# Patient Record
Sex: Male | Born: 1970 | Race: Black or African American | Hispanic: No | Marital: Married | Smoking: Light tobacco smoker
Health system: Southern US, Community
[De-identification: ages and names within clinical notes are randomized; demographics above are authoritative.]

## PROBLEM LIST (undated history)

## (undated) DIAGNOSIS — E785 Hyperlipidemia, unspecified: Secondary | ICD-10-CM

## (undated) DIAGNOSIS — K219 Gastro-esophageal reflux disease without esophagitis: Secondary | ICD-10-CM

## (undated) DIAGNOSIS — F419 Anxiety disorder, unspecified: Secondary | ICD-10-CM

## (undated) DIAGNOSIS — F1020 Alcohol dependence, uncomplicated: Secondary | ICD-10-CM

## (undated) DIAGNOSIS — I10 Essential (primary) hypertension: Secondary | ICD-10-CM

## (undated) DIAGNOSIS — M5432 Sciatica, left side: Secondary | ICD-10-CM

## (undated) HISTORY — DX: Sciatica, left side: M54.32

## (undated) HISTORY — PX: OTHER SURGICAL HISTORY: SHX169

## (undated) HISTORY — DX: Alcohol dependence, uncomplicated: F10.20

## (undated) HISTORY — DX: Anxiety disorder, unspecified: F41.9

## (undated) HISTORY — DX: Morbid (severe) obesity due to excess calories: E66.01

## (undated) HISTORY — DX: Hyperlipidemia, unspecified: E78.5

## (undated) HISTORY — DX: Gastro-esophageal reflux disease without esophagitis: K21.9

---

## 2010-07-28 ENCOUNTER — Emergency Department (HOSPITAL_COMMUNITY)
Admission: EM | Admit: 2010-07-28 | Discharge: 2010-07-28 | Payer: Self-pay | Source: Home / Self Care | Admitting: Emergency Medicine

## 2010-10-27 LAB — RAPID STREP SCREEN (MED CTR MEBANE ONLY): Streptococcus, Group A Screen (Direct): NEGATIVE

## 2013-11-20 ENCOUNTER — Ambulatory Visit: Payer: Self-pay | Admitting: Podiatry

## 2014-11-01 ENCOUNTER — Ambulatory Visit (INDEPENDENT_AMBULATORY_CARE_PROVIDER_SITE_OTHER): Payer: BLUE CROSS/BLUE SHIELD

## 2014-11-01 ENCOUNTER — Ambulatory Visit (INDEPENDENT_AMBULATORY_CARE_PROVIDER_SITE_OTHER): Payer: BLUE CROSS/BLUE SHIELD | Admitting: Podiatry

## 2014-11-01 VITALS — BP 140/91 | HR 49 | Resp 15

## 2014-11-01 DIAGNOSIS — Q669 Congenital deformity of feet, unspecified, unspecified foot: Secondary | ICD-10-CM

## 2014-11-01 DIAGNOSIS — L989 Disorder of the skin and subcutaneous tissue, unspecified: Secondary | ICD-10-CM | POA: Diagnosis not present

## 2014-11-01 DIAGNOSIS — M79671 Pain in right foot: Secondary | ICD-10-CM

## 2014-11-01 DIAGNOSIS — M79672 Pain in left foot: Secondary | ICD-10-CM

## 2014-11-01 DIAGNOSIS — Q828 Other specified congenital malformations of skin: Secondary | ICD-10-CM

## 2014-11-01 NOTE — Progress Notes (Signed)
**Note Terrence-Identified via Obfuscation**    Subjective:    Patient ID: Terrence Conrad, male    DOB: [birth year], 44 y.o.   MRN: [id number]  HPI  44 year old male presents the office today for complaints of warts/calluses to the bottoms of his feet bilaterally. He states that he has these areas in both of his feet which are painful particularly with weightbearing and pressure. He states it. He tries to trim the areas himself. He states that when he trims them he does not have any bleeding or any drainage. He denies any recent redness around the areas. Denies any recent injury or trauma. No other complaints at this time.   Review of Systems  Musculoskeletal: Positive for back pain and neck pain.  All other systems reviewed and are negative.      Objective:   Physical Exam AAO x3, NAD DP/PT pulses palpable bilaterally, CRT less than 3 seconds Protective sensation intact with Simms Weinstein monofilament, vibratory sensation intact, Achilles tendon reflex intact There are multiple areas of hyperkeratotic lesions to bilateral plantar feet. There are 5 lesions on the right foot and 3 lesions on the left foot on the plantar aspect of the foot. Upon debridement there is no pinpoint bleeding, open lesions, drainage, or clinical signs of infection. There is mild tenderness palpation directly over the hyperkeratotic lesions prior to debridement.  No open lesions or pre-ulcerative lesions identified bilaterally. No other areas of tenderness to bilateral lower extremities. Decrease in medial arch height upon weightbearing bilaterally. MMT 5/5, ROM WNL. No overlying edema, erythema, increase in warmth to bilateral lower extremities.  No pain with calf compression, swelling, warmth, erythema bilaterally.      Assessment & Plan:  44 year old male with symptomatic hyperkeratotic lesions, flatfoot deformity. -X-rays were obtained and reviewed with the patient. -Treatment options were discussed including alternatives, risks,  complications. -Hyperkeratotic lesions are sharply debrided without complication/bleeding. Early no evidence of verruca. Pads placed around the lesions followed by salinocaine  and a bandage. Post procedure care was discussed with the patient. -Due to the numerous lesions and flatfoot deformity recommended orthotics. This time he wishes to proceed with custom orthotics. [facility] -Follow-up in orthotics arrive or sooner if any problems are to arise. In the meantime encouraged to call the office with any questions, concerns, change in symptoms.

## 2014-11-22 ENCOUNTER — Ambulatory Visit: Payer: BLUE CROSS/BLUE SHIELD | Admitting: *Deleted

## 2014-11-22 DIAGNOSIS — Q669 Congenital deformity of feet, unspecified, unspecified foot: Secondary | ICD-10-CM

## 2014-11-22 NOTE — Progress Notes (Signed)
**Note Terrence-Identified via Obfuscation** Patient ID: Terrence Conrad, male   DOB: 10/27/1970, 44 y.o.   MRN: [id number] PICKING UP INSERTS

## 2014-11-22 NOTE — Patient Instructions (Signed)

## 2014-12-27 ENCOUNTER — Ambulatory Visit (INDEPENDENT_AMBULATORY_CARE_PROVIDER_SITE_OTHER): Payer: BLUE CROSS/BLUE SHIELD | Admitting: Podiatry

## 2014-12-27 ENCOUNTER — Encounter: Payer: Self-pay | Admitting: Podiatry

## 2014-12-27 VITALS — BP 139/90 | HR 71 | Resp 14

## 2014-12-27 DIAGNOSIS — B372 Candidiasis of skin and nail: Secondary | ICD-10-CM

## 2014-12-27 MED ORDER — TERBINAFINE HCL 250 MG PO TABS: 250.0000 mg | ORAL_TABLET | Freq: Every day | ORAL | Status: AC

## 2014-12-29 NOTE — Progress Notes (Signed)
**Note Terrence-Identified via Obfuscation** Subjective:     Patient ID: Terrence Conrad, male   DOB: [birth year], 44 y.o.   MRN: [id number]  HPI patient is a patient of Dr. Ardelle AntonWagoner and presents with a strain on the plantar aspect of both feet with some itchy component but mostly just irritated type tissue   Review of Systems     Objective:   Physical Exam Neurovascular status intact no change in health history with irritated plantar foot condition bilateral and a moccasin appearance with small blisters but no drainage    Assessment:     Probable fungal infection plantar aspect bilateral    Plan:     Advised on soaks and we'll start him on oral Lamisil 250 mg daily first 45 days. Patient will reappoint if symptoms do not get better or get worse or if any other issues should occur

## 2015-10-06 ENCOUNTER — Emergency Department (HOSPITAL_COMMUNITY)
Admission: EM | Admit: 2015-10-06 | Discharge: 2015-10-06 | Disposition: A | Discharge status: Home / Self Care | Payer: BLUE CROSS/BLUE SHIELD | Attending: Emergency Medicine | Admitting: Emergency Medicine

## 2015-10-06 ENCOUNTER — Encounter (HOSPITAL_COMMUNITY): Payer: Self-pay

## 2015-10-06 DIAGNOSIS — F17210 Nicotine dependence, cigarettes, uncomplicated: Secondary | ICD-10-CM | POA: Diagnosis not present

## 2015-10-06 DIAGNOSIS — Z79899 Other long term (current) drug therapy: Secondary | ICD-10-CM | POA: Diagnosis not present

## 2015-10-06 DIAGNOSIS — J029 Acute pharyngitis, unspecified: Secondary | ICD-10-CM | POA: Diagnosis present

## 2015-10-06 DIAGNOSIS — I10 Essential (primary) hypertension: Secondary | ICD-10-CM | POA: Insufficient documentation

## 2015-10-06 DIAGNOSIS — J36 Peritonsillar abscess: Secondary | ICD-10-CM

## 2015-10-06 HISTORY — DX: Essential (primary) hypertension: I10

## 2015-10-06 LAB — CBC WITH DIFFERENTIAL/PLATELET
BASOS PCT: 0 %
Basophils Absolute: 0 10*3/uL (ref 0.0–0.1)
EOS ABS: 0 10*3/uL (ref 0.0–0.7)
EOS PCT: 0 %
HCT: 43.3 % (ref 39.0–52.0)
HEMOGLOBIN: 13.9 g/dL (ref 13.0–17.0)
Lymphocytes Relative: 13 %
Lymphs Abs: 2.2 10*3/uL (ref 0.7–4.0)
MCH: 26.6 pg (ref 26.0–34.0)
MCHC: 32.1 g/dL (ref 30.0–36.0)
MCV: 82.8 fL (ref 78.0–100.0)
Monocytes Absolute: 1.3 10*3/uL — ABNORMAL HIGH (ref 0.1–1.0)
Monocytes Relative: 8 %
NEUTROS PCT: 79 %
Neutro Abs: 13.2 10*3/uL — ABNORMAL HIGH (ref 1.7–7.7)
PLATELETS: 319 10*3/uL (ref 150–400)
RBC: 5.23 MIL/uL (ref 4.22–5.81)
RDW: 14.9 % (ref 11.5–15.5)
WBC: 16.7 10*3/uL — AB (ref 4.0–10.5)

## 2015-10-06 LAB — I-STAT CHEM 8, ED
BUN: 16 mg/dL (ref 6–20)
CALCIUM ION: 1.16 mmol/L (ref 1.12–1.23)
CHLORIDE: 103 mmol/L (ref 101–111)
Creatinine, Ser: 1.1 mg/dL (ref 0.61–1.24)
Glucose, Bld: 87 mg/dL (ref 65–99)
HEMATOCRIT: 48 % (ref 39.0–52.0)
Hemoglobin: 16.3 g/dL (ref 13.0–17.0)
Potassium: 3.8 mmol/L (ref 3.5–5.1)
SODIUM: 143 mmol/L (ref 135–145)
TCO2: 29 mmol/L (ref 0–100)

## 2015-10-06 MED ORDER — AMOXICILLIN-POT CLAVULANATE 875-125 MG PO TABS: 1.0000 | ORAL_TABLET | Freq: Two times a day (BID) | ORAL | Status: DC

## 2015-10-06 MED ORDER — SODIUM CHLORIDE 0.9 % IV SOLN
3.0000 g | Freq: Once | INTRAVENOUS | Status: AC
  Administered 2015-10-06: 3 g via INTRAVENOUS
  Filled 2015-10-06: qty 3

## 2015-10-06 MED ORDER — SODIUM CHLORIDE 0.9 % IV BOLUS (SEPSIS)
1000.0000 mL | Freq: Once | INTRAVENOUS | Status: AC
Start: 2015-10-06 — End: 2015-10-06
  Administered 2015-10-06: 1000 mL via INTRAVENOUS

## 2015-10-06 MED ORDER — HYDROMORPHONE HCL 1 MG/ML IJ SOLN
1.0000 mg | Freq: Once | INTRAMUSCULAR | Status: AC
  Administered 2015-10-06: 1 mg via INTRAVENOUS
  Filled 2015-10-06: qty 1

## 2015-10-06 MED ORDER — HYDROCODONE-ACETAMINOPHEN 7.5-325 MG/15ML PO SOLN
15.0000 mL | Freq: Four times a day (QID) | ORAL | Status: DC | PRN
Start: 2015-10-06 — End: 2019-04-30

## 2015-10-06 MED ORDER — DEXAMETHASONE SODIUM PHOSPHATE 10 MG/ML IJ SOLN
10.0000 mg | Freq: Once | INTRAMUSCULAR | Status: AC
  Administered 2015-10-06: 10 mg via INTRAVENOUS
  Filled 2015-10-06: qty 1

## 2015-10-06 NOTE — ED Provider Notes (Signed)
CSN: [id number]     Arrival date & time 10/06/15  1529 History   First MD Initiated Contact with Patient 10/06/15 1825     Chief Complaint  Patient presents with  . peritonsilar abscess    The history is provided by the patient.   patient presents with sore throat for last 3 days. Is on the left side. Some pain with swallowing skills been able swallow. States he's been having chills. Sent at urgent care and sent here for peritonsillar abscess. He is otherwise healthy but does have a history of hypertension. No difficulty breathing. Some difficulty opening the jaw due to the pain.  Past Medical History  Diagnosis Date  . Hypertension    History reviewed. No pertinent past surgical history. No family history on file. Social History  Substance Use Topics  . Smoking status: Light Tobacco Smoker  . Smokeless tobacco: None  . Alcohol Use: None    Review of Systems  Constitutional: Positive for chills and appetite change.  HENT: Positive for sore throat, trouble swallowing and voice change. Negative for drooling and facial swelling.   Respiratory: Negative for shortness of breath.   Cardiovascular: Negative for chest pain.  Gastrointestinal: Negative for abdominal pain.  Genitourinary: Negative for flank pain.  Musculoskeletal: Negative for back pain.  Skin: Negative for wound.      Allergies  Review of patient's allergies indicates no known allergies.  Home Medications   Prior to Admission medications   Medication Sig Start Date End Date Taking? Authorizing Provider  ciprofloxacin (CIPRO) 500 MG tablet Take 500 mg by mouth 2 (two) times daily. for 10 days 09/30/15 10/09/15 Yes Historical Provider, MD  ibuprofen (ADVIL,MOTRIN) 800 MG tablet Take 800 mg by mouth 3 (three) times daily as needed for moderate pain.  09/23/15  Yes Historical Provider, MD  losartan (COZAAR) 50 MG tablet Take 50 mg by mouth daily. 09/02/15  Yes Historical Provider, MD  mometasone (ELOCON) 0.1 % cream Apply  1 application topically daily as needed (FOR IRRITATION).  09/06/15  Yes Historical Provider, MD  omeprazole (PRILOSEC) 20 MG capsule Take 20 mg by mouth daily. 08/04/15  Yes Historical Provider, MD  Phenylephrine-DM-GG (MUCINEX FAST-MAX CONGEST COUGH) 2.5-5-100 MG/5ML LIQD Take 5 mLs by mouth daily as needed (for congestion).   Yes Historical Provider, MD  tiZANidine (ZANAFLEX) 4 MG tablet Take 4 mg by mouth every 8 (eight) hours as needed for muscle spasms.  09/06/15  Yes Historical Provider, MD  amoxicillin-clavulanate (AUGMENTIN) 875-125 MG tablet Take 1 tablet by mouth every 12 (twelve) hours. 10/06/15   Benjiman Core, MD  HYDROcodone-acetaminophen (HYCET) 7.5-325 mg/15 ml solution Take 15 mLs by mouth every 6 (six) hours as needed for moderate pain. 10/06/15   Benjiman Core, MD  terbinafine (LAMISIL) 250 MG tablet Take 1 tablet (250 mg total) by mouth daily. 12/27/14   Lenn Sink, DPM   BP 139/77 mmHg  Pulse 69  Temp(Src) 100.5 F (38.1 C) (Oral)  Resp 18  Ht  (1.88 m)  Wt 260 lb (117.935 kg)  BMI 33.37 kg/m2  SpO2 98% Physical Exam  Constitutional: He appears well-nourished.  HENT:  Posterior pharyngeal edema and swelling on left side. Peritonsillar swelling. Uvula shifted to right. Some trismus. Some swelling on left neck anteriorly.  Eyes: EOM are normal.  Neck: Neck supple.  Cardiovascular: Normal rate.   Pulmonary/Chest: Effort normal. He has no wheezes.  Abdominal: Soft. There is no tenderness.  Musculoskeletal: Normal range of motion.  Lymphadenopathy:  He has cervical adenopathy.  Neurological: He is alert.  Skin: Skin is warm. No erythema.    ED Course  Procedures (including critical care time) Labs Review Labs Reviewed  CBC WITH DIFFERENTIAL/PLATELET - Abnormal; Notable for the following:    WBC 16.7 (*)    Neutro Abs 13.2 (*)    Monocytes Absolute 1.3 (*)    All other components within normal limits  I-STAT CHEM 8, ED    Imaging Review No  results found. I have personally reviewed and evaluated these images and lab results as part of my medical decision-making.   EKG Interpretation None      MDM   Final diagnoses:  Peritonsillar abscess    Patient possible peritonsillar abscess. Feels much better after treatment. Not having difficulty breathing or handling secretions. Discussed with Dr. Jearld Fenton, will see the patient in the office tomorrow. Will discharge home.    Benjiman Core, MD 10/07/15 [phone]

## 2015-10-06 NOTE — Discharge Instructions (Signed)
Peritonsillar Abscess °A peritonsillar abscess is a collection of yellowish-white fluid (pus) in the back of the throat behind the tonsils. It usually occurs when an infection of the throat or tonsils (tonsillitis) spreads into the tissues around the tonsils. °CAUSES °The infection that leads to a peritonsillar abscess is usually caused by streptococcal bacteria.  °SIGNS AND SYMPTOMS °· Sore throat, often with pain on just one side. °· Swelling and tenderness of the glands (lymph nodes) in the neck. °· Difficulty swallowing. °· Difficulty opening your mouth. °· Fever. °· Chills. °· Drooling because of difficulty swallowing saliva. °· Headache. °· Changes in your voice. °· Bad breath. °DIAGNOSIS °Your health care provider will take your medical history and do a physical exam. Imaging tests may be done, such as an ultrasound or CT scan. A sample of pus may be removed from the abscess using a needle (needle aspiration) or by swabbing the back of your throat. This sample will be sent to a lab for testing. °TREATMENT °Treatment usually involves draining the pus from the abscess. This may be done through needle aspiration or by making an incision in the abscess. You will also likely need to take antibiotic medicine. °HOME CARE INSTRUCTIONS °· Rest as much as possible and get plenty of sleep. °· Take medicines only as directed by your health care provider. °· If you were prescribed an antibiotic medicine, finish it all even if you start to feel better. °· If your abscess was drained by your health care provider, gargle with a mixture of salt and warm water: °¨ Mix 1 tsp of salt in 8 oz of warm water. °¨ Gargle with this mixture four times per day or as needed for comfort. °¨ Do not swallow this mixture. °· Drink plenty of fluids. °· While your throat is sore, eat soft or liquid foods, such as frozen ice pops and ice cream. °· Keep all follow-up visits as directed by your health care provider. This is important. °SEEK  MEDICAL CARE IF: °· You have increased pain, swelling, redness, or drainage in your throat. °· You develop a headache, a lack of energy (lethargy), or generalized feelings of illness. °· You have a fever. °· You feel dizzy. °· You have difficulty swallowing or eating. °· You show signs of becoming dehydrated, such as: °¨ Light-headedness when standing. °¨ Decreased urine output. °¨ A fast heart rate. °¨ Dry mouth. °SEEK IMMEDIATE MEDICAL CARE IF:  °· You have difficulty talking or breathing, or you find it easier to breathe when you lean forward. °· You are coughing up blood or vomiting blood. °· You have severe throat pain that is not helped by medicines. °· You start to drool. °  °This information is not intended to replace advice given to you by your health care provider. Make sure you discuss any questions you have with your health care provider. °  °Document Released: 08/02/2005 Document Revised: 08/23/2014 Document Reviewed: 03/18/2014 °Elsevier Interactive Patient Education ©2016 [employer] ° °

## 2015-10-06 NOTE — ED Notes (Signed)
Patient here with 3 days of sore throat, here from urgent care with peritonsillar abscess

## 2016-05-28 DIAGNOSIS — Z716 Tobacco abuse counseling: Secondary | ICD-10-CM | POA: Diagnosis not present

## 2016-05-28 DIAGNOSIS — I10 Essential (primary) hypertension: Secondary | ICD-10-CM | POA: Diagnosis not present

## 2016-05-28 DIAGNOSIS — L2082 Flexural eczema: Secondary | ICD-10-CM | POA: Diagnosis not present

## 2016-05-28 DIAGNOSIS — R7303 Prediabetes: Secondary | ICD-10-CM | POA: Diagnosis not present

## 2016-08-25 DIAGNOSIS — Z13220 Encounter for screening for lipoid disorders: Secondary | ICD-10-CM | POA: Diagnosis not present

## 2016-08-25 DIAGNOSIS — R7309 Other abnormal glucose: Secondary | ICD-10-CM | POA: Diagnosis not present

## 2016-08-25 DIAGNOSIS — R12 Heartburn: Secondary | ICD-10-CM | POA: Diagnosis not present

## 2016-08-25 DIAGNOSIS — Z125 Encounter for screening for malignant neoplasm of prostate: Secondary | ICD-10-CM | POA: Diagnosis not present

## 2016-08-25 DIAGNOSIS — Z0000 Encounter for general adult medical examination without abnormal findings: Secondary | ICD-10-CM | POA: Diagnosis not present

## 2016-08-25 DIAGNOSIS — I10 Essential (primary) hypertension: Secondary | ICD-10-CM | POA: Diagnosis not present

## 2016-08-25 DIAGNOSIS — Z720 Tobacco use: Secondary | ICD-10-CM | POA: Diagnosis not present

## 2017-08-31 DIAGNOSIS — E119 Type 2 diabetes mellitus without complications: Secondary | ICD-10-CM | POA: Diagnosis not present

## 2017-08-31 DIAGNOSIS — Z23 Encounter for immunization: Secondary | ICD-10-CM | POA: Diagnosis not present

## 2017-08-31 DIAGNOSIS — R12 Heartburn: Secondary | ICD-10-CM | POA: Diagnosis not present

## 2017-08-31 DIAGNOSIS — I10 Essential (primary) hypertension: Secondary | ICD-10-CM | POA: Diagnosis not present

## 2017-08-31 DIAGNOSIS — Z720 Tobacco use: Secondary | ICD-10-CM | POA: Diagnosis not present

## 2017-09-30 DIAGNOSIS — R609 Edema, unspecified: Secondary | ICD-10-CM | POA: Diagnosis not present

## 2017-09-30 DIAGNOSIS — M79606 Pain in leg, unspecified: Secondary | ICD-10-CM | POA: Diagnosis not present

## 2017-09-30 DIAGNOSIS — M79604 Pain in right leg: Secondary | ICD-10-CM | POA: Diagnosis not present

## 2017-09-30 DIAGNOSIS — M7989 Other specified soft tissue disorders: Secondary | ICD-10-CM | POA: Diagnosis not present

## 2017-10-04 DIAGNOSIS — M79661 Pain in right lower leg: Secondary | ICD-10-CM | POA: Diagnosis not present

## 2017-10-07 ENCOUNTER — Other Ambulatory Visit: Payer: Self-pay | Admitting: Physician Assistant

## 2017-10-07 DIAGNOSIS — M7989 Other specified soft tissue disorders: Secondary | ICD-10-CM | POA: Diagnosis not present

## 2017-10-07 DIAGNOSIS — M25571 Pain in right ankle and joints of right foot: Secondary | ICD-10-CM | POA: Diagnosis not present

## 2017-10-07 DIAGNOSIS — M79604 Pain in right leg: Secondary | ICD-10-CM

## 2017-10-07 DIAGNOSIS — M79606 Pain in leg, unspecified: Secondary | ICD-10-CM | POA: Diagnosis not present

## 2017-10-08 ENCOUNTER — Ambulatory Visit (HOSPITAL_COMMUNITY)
Admission: RE | Admit: 2017-10-08 | Discharge: 2017-10-08 | Disposition: A | Discharge status: Home / Self Care | Payer: BLUE CROSS/BLUE SHIELD | Source: Ambulatory Visit | Attending: Physician Assistant | Admitting: Physician Assistant

## 2017-10-08 ENCOUNTER — Encounter (INDEPENDENT_AMBULATORY_CARE_PROVIDER_SITE_OTHER): Payer: Self-pay

## 2017-10-08 DIAGNOSIS — M79604 Pain in right leg: Secondary | ICD-10-CM | POA: Diagnosis not present

## 2017-10-08 DIAGNOSIS — M7989 Other specified soft tissue disorders: Secondary | ICD-10-CM | POA: Diagnosis not present

## 2017-10-08 NOTE — Progress Notes (Signed)
VASCULAR LAB PRELIMINARY  PRELIMINARY  PRELIMINARY  PRELIMINARY  Right lower extremity venous duplex completed.    Preliminary report:  There is no DVT or SVT noted in the right lower extremity.  Hypoechoic are noted in the proximal calf at site of pain, possibly consistent with muscle tear.   Rogena Deupree, RVT 10/08/2017, 10:16 AM

## 2017-10-17 DIAGNOSIS — M25571 Pain in right ankle and joints of right foot: Secondary | ICD-10-CM | POA: Diagnosis not present

## 2017-10-19 DIAGNOSIS — S86111D Strain of other muscle(s) and tendon(s) of posterior muscle group at lower leg level, right leg, subsequent encounter: Secondary | ICD-10-CM | POA: Diagnosis not present

## 2017-10-19 DIAGNOSIS — M79661 Pain in right lower leg: Secondary | ICD-10-CM | POA: Diagnosis not present

## 2017-10-19 DIAGNOSIS — R262 Difficulty in walking, not elsewhere classified: Secondary | ICD-10-CM | POA: Diagnosis not present

## 2017-10-19 DIAGNOSIS — M6281 Muscle weakness (generalized): Secondary | ICD-10-CM | POA: Diagnosis not present

## 2017-10-31 DIAGNOSIS — M6281 Muscle weakness (generalized): Secondary | ICD-10-CM | POA: Diagnosis not present

## 2017-11-08 DIAGNOSIS — R262 Difficulty in walking, not elsewhere classified: Secondary | ICD-10-CM | POA: Diagnosis not present

## 2017-11-08 DIAGNOSIS — M6281 Muscle weakness (generalized): Secondary | ICD-10-CM | POA: Diagnosis not present

## 2017-11-08 DIAGNOSIS — S86111D Strain of other muscle(s) and tendon(s) of posterior muscle group at lower leg level, right leg, subsequent encounter: Secondary | ICD-10-CM | POA: Diagnosis not present

## 2017-11-08 DIAGNOSIS — M79661 Pain in right lower leg: Secondary | ICD-10-CM | POA: Diagnosis not present

## 2017-11-10 DIAGNOSIS — S86111D Strain of other muscle(s) and tendon(s) of posterior muscle group at lower leg level, right leg, subsequent encounter: Secondary | ICD-10-CM | POA: Diagnosis not present

## 2017-11-10 DIAGNOSIS — M79661 Pain in right lower leg: Secondary | ICD-10-CM | POA: Diagnosis not present

## 2017-11-10 DIAGNOSIS — R262 Difficulty in walking, not elsewhere classified: Secondary | ICD-10-CM | POA: Diagnosis not present

## 2017-11-10 DIAGNOSIS — M6281 Muscle weakness (generalized): Secondary | ICD-10-CM | POA: Diagnosis not present

## 2017-11-15 DIAGNOSIS — M79661 Pain in right lower leg: Secondary | ICD-10-CM | POA: Diagnosis not present

## 2017-11-15 DIAGNOSIS — M6281 Muscle weakness (generalized): Secondary | ICD-10-CM | POA: Diagnosis not present

## 2017-11-15 DIAGNOSIS — R262 Difficulty in walking, not elsewhere classified: Secondary | ICD-10-CM | POA: Diagnosis not present

## 2017-11-15 DIAGNOSIS — S86111D Strain of other muscle(s) and tendon(s) of posterior muscle group at lower leg level, right leg, subsequent encounter: Secondary | ICD-10-CM | POA: Diagnosis not present

## 2017-11-17 DIAGNOSIS — R262 Difficulty in walking, not elsewhere classified: Secondary | ICD-10-CM | POA: Diagnosis not present

## 2017-11-17 DIAGNOSIS — S86111D Strain of other muscle(s) and tendon(s) of posterior muscle group at lower leg level, right leg, subsequent encounter: Secondary | ICD-10-CM | POA: Diagnosis not present

## 2017-11-17 DIAGNOSIS — M79661 Pain in right lower leg: Secondary | ICD-10-CM | POA: Diagnosis not present

## 2017-11-17 DIAGNOSIS — M6281 Muscle weakness (generalized): Secondary | ICD-10-CM | POA: Diagnosis not present

## 2018-02-10 DIAGNOSIS — L080 Pyoderma: Secondary | ICD-10-CM | POA: Diagnosis not present

## 2018-02-10 DIAGNOSIS — L309 Dermatitis, unspecified: Secondary | ICD-10-CM | POA: Diagnosis not present

## 2018-02-13 DIAGNOSIS — L2082 Flexural eczema: Secondary | ICD-10-CM | POA: Diagnosis not present

## 2018-02-13 DIAGNOSIS — I10 Essential (primary) hypertension: Secondary | ICD-10-CM | POA: Diagnosis not present

## 2018-02-13 DIAGNOSIS — Z125 Encounter for screening for malignant neoplasm of prostate: Secondary | ICD-10-CM | POA: Diagnosis not present

## 2018-02-13 DIAGNOSIS — Z0000 Encounter for general adult medical examination without abnormal findings: Secondary | ICD-10-CM | POA: Diagnosis not present

## 2018-02-13 DIAGNOSIS — Z13220 Encounter for screening for lipoid disorders: Secondary | ICD-10-CM | POA: Diagnosis not present

## 2018-02-13 DIAGNOSIS — E119 Type 2 diabetes mellitus without complications: Secondary | ICD-10-CM | POA: Diagnosis not present

## 2018-02-13 DIAGNOSIS — E669 Obesity, unspecified: Secondary | ICD-10-CM | POA: Diagnosis not present

## 2018-12-04 DIAGNOSIS — K219 Gastro-esophageal reflux disease without esophagitis: Secondary | ICD-10-CM | POA: Diagnosis not present

## 2018-12-04 DIAGNOSIS — E119 Type 2 diabetes mellitus without complications: Secondary | ICD-10-CM | POA: Diagnosis not present

## 2018-12-04 DIAGNOSIS — M62838 Other muscle spasm: Secondary | ICD-10-CM | POA: Diagnosis not present

## 2018-12-04 DIAGNOSIS — I10 Essential (primary) hypertension: Secondary | ICD-10-CM | POA: Diagnosis not present

## 2018-12-07 DIAGNOSIS — E119 Type 2 diabetes mellitus without complications: Secondary | ICD-10-CM | POA: Diagnosis not present

## 2018-12-07 DIAGNOSIS — I10 Essential (primary) hypertension: Secondary | ICD-10-CM | POA: Diagnosis not present

## 2018-12-07 DIAGNOSIS — R5383 Other fatigue: Secondary | ICD-10-CM | POA: Diagnosis not present

## 2019-04-30 ENCOUNTER — Emergency Department (HOSPITAL_COMMUNITY): Payer: BC Managed Care – PPO

## 2019-04-30 ENCOUNTER — Emergency Department (HOSPITAL_COMMUNITY)
Admission: EM | Admit: 2019-04-30 | Discharge: 2019-04-30 | Disposition: A | Discharge status: Home / Self Care | Payer: BC Managed Care – PPO | Attending: Emergency Medicine | Admitting: Emergency Medicine

## 2019-04-30 ENCOUNTER — Encounter (HOSPITAL_COMMUNITY): Payer: Self-pay | Admitting: Emergency Medicine

## 2019-04-30 ENCOUNTER — Other Ambulatory Visit: Payer: Self-pay

## 2019-04-30 DIAGNOSIS — M25531 Pain in right wrist: Secondary | ICD-10-CM | POA: Insufficient documentation

## 2019-04-30 DIAGNOSIS — S199XXA Unspecified injury of neck, initial encounter: Secondary | ICD-10-CM | POA: Diagnosis not present

## 2019-04-30 DIAGNOSIS — S20211A Contusion of right front wall of thorax, initial encounter: Secondary | ICD-10-CM | POA: Diagnosis not present

## 2019-04-30 DIAGNOSIS — Y92410 Unspecified street and highway as the place of occurrence of the external cause: Secondary | ICD-10-CM | POA: Insufficient documentation

## 2019-04-30 DIAGNOSIS — Y998 Other external cause status: Secondary | ICD-10-CM | POA: Diagnosis not present

## 2019-04-30 DIAGNOSIS — S299XXA Unspecified injury of thorax, initial encounter: Secondary | ICD-10-CM | POA: Diagnosis not present

## 2019-04-30 DIAGNOSIS — S20212A Contusion of left front wall of thorax, initial encounter: Secondary | ICD-10-CM

## 2019-04-30 DIAGNOSIS — S301XXA Contusion of abdominal wall, initial encounter: Secondary | ICD-10-CM | POA: Diagnosis not present

## 2019-04-30 DIAGNOSIS — Z79899 Other long term (current) drug therapy: Secondary | ICD-10-CM | POA: Insufficient documentation

## 2019-04-30 DIAGNOSIS — M7989 Other specified soft tissue disorders: Secondary | ICD-10-CM | POA: Diagnosis not present

## 2019-04-30 DIAGNOSIS — Y9389 Activity, other specified: Secondary | ICD-10-CM | POA: Diagnosis not present

## 2019-04-30 DIAGNOSIS — I10 Essential (primary) hypertension: Secondary | ICD-10-CM | POA: Insufficient documentation

## 2019-04-30 DIAGNOSIS — R079 Chest pain, unspecified: Secondary | ICD-10-CM | POA: Diagnosis not present

## 2019-04-30 DIAGNOSIS — R609 Edema, unspecified: Secondary | ICD-10-CM | POA: Diagnosis not present

## 2019-04-30 DIAGNOSIS — S0990XA Unspecified injury of head, initial encounter: Secondary | ICD-10-CM | POA: Insufficient documentation

## 2019-04-30 DIAGNOSIS — F17210 Nicotine dependence, cigarettes, uncomplicated: Secondary | ICD-10-CM | POA: Diagnosis not present

## 2019-04-30 DIAGNOSIS — S6991XA Unspecified injury of right wrist, hand and finger(s), initial encounter: Secondary | ICD-10-CM | POA: Diagnosis not present

## 2019-04-30 DIAGNOSIS — S3993XA Unspecified injury of pelvis, initial encounter: Secondary | ICD-10-CM | POA: Diagnosis not present

## 2019-04-30 LAB — COMPREHENSIVE METABOLIC PANEL
ALT: 24 U/L (ref 0–44)
AST: 24 U/L (ref 15–41)
Albumin: 3.4 g/dL — ABNORMAL LOW (ref 3.5–5.0)
Alkaline Phosphatase: 75 U/L (ref 38–126)
Anion gap: 10 (ref 5–15)
BUN: 10 mg/dL (ref 6–20)
CO2: 22 mmol/L (ref 22–32)
Calcium: 9 mg/dL (ref 8.9–10.3)
Chloride: 107 mmol/L (ref 98–111)
Creatinine, Ser: 1.01 mg/dL (ref 0.61–1.24)
GFR calc Af Amer: >60 mL/min (ref >60)
GFR calc non Af Amer: >60 mL/min (ref >60)
Glucose, Bld: 167 mg/dL — ABNORMAL HIGH (ref 70–99)
Potassium: 3.6 mmol/L (ref 3.5–5.1)
Sodium: 139 mmol/L (ref 135–145)
Total Bilirubin: 0.5 mg/dL (ref 0.3–1.2)
Total Protein: 6.6 g/dL (ref 6.5–8.1)

## 2019-04-30 LAB — URINALYSIS, ROUTINE W REFLEX MICROSCOPIC
Bilirubin Urine: NEGATIVE
Glucose, UA: NEGATIVE mg/dL
Hgb urine dipstick: NEGATIVE
Ketones, ur: NEGATIVE mg/dL
Leukocytes,Ua: NEGATIVE
Nitrite: NEGATIVE
Protein, ur: NEGATIVE mg/dL
Specific Gravity, Urine: 1.045 — ABNORMAL HIGH (ref 1.005–1.030)
pH: 7 (ref 5.0–8.0)

## 2019-04-30 LAB — I-STAT CHEM 8, ED
BUN: 12 mg/dL (ref 6–20)
Calcium, Ion: 1.13 mmol/L — ABNORMAL LOW (ref 1.15–1.40)
Chloride: 107 mmol/L (ref 98–111)
Creatinine, Ser: 1 mg/dL (ref 0.61–1.24)
Glucose, Bld: 161 mg/dL — ABNORMAL HIGH (ref 70–99)
HCT: 40 % (ref 39.0–52.0)
Hemoglobin: 13.6 g/dL (ref 13.0–17.0)
Potassium: 3.6 mmol/L (ref 3.5–5.1)
Sodium: 142 mmol/L (ref 135–145)
TCO2: 25 mmol/L (ref 22–32)

## 2019-04-30 LAB — CBC
HCT: 40.9 % (ref 39.0–52.0)
Hemoglobin: 12.6 g/dL — ABNORMAL LOW (ref 13.0–17.0)
MCH: 25.3 pg — ABNORMAL LOW (ref 26.0–34.0)
MCHC: 30.8 g/dL (ref 30.0–36.0)
MCV: 82 fL (ref 80.0–100.0)
Platelets: 320 10*3/uL (ref 150–400)
RBC: 4.99 MIL/uL (ref 4.22–5.81)
RDW: 16.3 % — ABNORMAL HIGH (ref 11.5–15.5)
WBC: 9.1 10*3/uL (ref 4.0–10.5)
nRBC: 0 % (ref 0.0–0.2)

## 2019-04-30 LAB — SAMPLE TO BLOOD BANK

## 2019-04-30 LAB — ETHANOL: Alcohol, Ethyl (B): <10 mg/dL (ref <10)

## 2019-04-30 LAB — LACTIC ACID, PLASMA: Lactic Acid, Venous: 1.9 mmol/L (ref 0.5–1.9)

## 2019-04-30 LAB — PROTIME-INR
INR: 0.9 (ref 0.8–1.2)
Prothrombin Time: 12.4 seconds (ref 11.4–15.2)

## 2019-04-30 MED ORDER — IOHEXOL 300 MG/ML  SOLN
125.0000 mL | Freq: Once | INTRAMUSCULAR | Status: AC | PRN
  Administered 2019-04-30: 125 mL via INTRAVENOUS

## 2019-04-30 MED ORDER — LOSARTAN POTASSIUM 50 MG PO TABS
75.0000 mg | ORAL_TABLET | Freq: Once | ORAL | Status: AC
  Administered 2019-04-30: 75 mg via ORAL
  Filled 2019-04-30: qty 2

## 2019-04-30 MED ORDER — HYDROCODONE-ACETAMINOPHEN 5-325 MG PO TABS: 1.0000 | ORAL_TABLET | ORAL | 0 refills | Status: AC | PRN

## 2019-04-30 MED ORDER — METHOCARBAMOL 500 MG PO TABS: 500.0000 mg | ORAL_TABLET | Freq: Three times a day (TID) | ORAL | 0 refills | Status: AC | PRN

## 2019-04-30 MED ORDER — LOSARTAN POTASSIUM 50 MG PO TABS: 50.0000 mg | ORAL_TABLET | Freq: Once | ORAL | Status: DC

## 2019-04-30 MED ORDER — IBUPROFEN 800 MG PO TABS: 800.0000 mg | ORAL_TABLET | Freq: Four times a day (QID) | ORAL | 0 refills | Status: AC | PRN

## 2019-04-30 MED ORDER — HYDROCODONE-ACETAMINOPHEN 5-325 MG PO TABS
2.0000 | ORAL_TABLET | Freq: Once | ORAL | Status: AC
  Administered 2019-04-30: 2 via ORAL
  Filled 2019-04-30: qty 2

## 2019-04-30 MED ORDER — FENTANYL CITRATE (PF) 100 MCG/2ML IJ SOLN
100.0000 ug | Freq: Once | INTRAMUSCULAR | Status: AC
  Administered 2019-04-30: 100 ug via INTRAVENOUS
  Filled 2019-04-30: qty 2

## 2019-04-30 NOTE — ED Provider Notes (Signed)
Signed out to d/c to home after wrist xray which was added.  Wrist xray reviewed - no def fx. sts noted. On recheck of patient, there is pain/tenderness on ulnar side of wrist/distal ulnar area, there is no scaphoid/snuff box pain or tenderness. Volar wrist splint for comfort. Icepack.   Pt requests pain med and his bp med. Hydrocodone po. Losartan po.   bp 174/96, hr 72, rr 16. Pulse ox is currently 98%. Breathing comfortable. Recheck abd soft non tender, no rebound or guarding.   Tolerating po. Ambulates w steady gait.   Patient currently appears stable for d/c.      Lajean Saver, MD 04/30/19 [phone]

## 2019-04-30 NOTE — Progress Notes (Signed)
Orthopedic Tech Progress Note Patient Details:  Terrence Conrad [birth year] [id number]  Ortho Devices Type of Ortho Device: Velcro wrist splint Ortho Device/Splint Interventions: Adjustment, Application   Post Interventions Patient Tolerated: Well Instructions Provided: Care of device   Melony Overly T 04/30/2019, 8:53 AM

## 2019-04-30 NOTE — Discharge Instructions (Addendum)
It was our pleasure to provide your ER care today - we hope that you feel better.  Wear wrist splint as needed for comfort/support for the next few days.   Icepack/cold to sore areas.   Take motrin or aleve as need for pain. You may also take hydrocodone as need for pain. No driving for the next 6 hours or when taking hydrocodone. Also, do not take tylenol or acetaminophen containing medication when taking hydrocodone. Take robaxin as need for muscle pain/spasm - no driving when taking.   Your blood pressure is high today - continue your medication, limit salt intake, and follow up with primary care doctor in the coming week. Also follow up with your doctor in the next couple weeks for recheck should pain/symptoms fail to improve/resolve.   Return to ER right away if worse, new symptoms, fevers, worsening or severe pain, worsening or severe abdominal pain, trouble breathing, or other concern.

## 2019-04-30 NOTE — ED Notes (Signed)
Ortho paged. 
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
## 2019-04-30 NOTE — ED Notes (Signed)
Patient ambulating within room without assist.
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
## 2019-05-02 LAB — CDS SEROLOGY

## 2019-05-03 DIAGNOSIS — S134XXA Sprain of ligaments of cervical spine, initial encounter: Secondary | ICD-10-CM | POA: Diagnosis not present

## 2019-05-18 DIAGNOSIS — M542 Cervicalgia: Secondary | ICD-10-CM | POA: Diagnosis not present

## 2019-05-18 DIAGNOSIS — E119 Type 2 diabetes mellitus without complications: Secondary | ICD-10-CM | POA: Diagnosis not present

## 2019-05-18 DIAGNOSIS — I10 Essential (primary) hypertension: Secondary | ICD-10-CM | POA: Diagnosis not present

## 2019-05-18 DIAGNOSIS — K219 Gastro-esophageal reflux disease without esophagitis: Secondary | ICD-10-CM | POA: Diagnosis not present

## 2019-07-23 DIAGNOSIS — R202 Paresthesia of skin: Secondary | ICD-10-CM | POA: Diagnosis not present

## 2019-07-23 DIAGNOSIS — F419 Anxiety disorder, unspecified: Secondary | ICD-10-CM | POA: Diagnosis not present

## 2019-07-23 DIAGNOSIS — G4709 Other insomnia: Secondary | ICD-10-CM | POA: Diagnosis not present

## 2019-12-04 ENCOUNTER — Other Ambulatory Visit: Payer: Self-pay | Admitting: Physician Assistant

## 2019-12-26 ENCOUNTER — Ambulatory Visit: Payer: BC Managed Care – PPO | Admitting: Physician Assistant

## 2020-02-13 DIAGNOSIS — I8311 Varicose veins of right lower extremity with inflammation: Secondary | ICD-10-CM | POA: Diagnosis not present

## 2020-02-15 DIAGNOSIS — I8311 Varicose veins of right lower extremity with inflammation: Secondary | ICD-10-CM | POA: Diagnosis not present

## 2020-03-28 DIAGNOSIS — I8311 Varicose veins of right lower extremity with inflammation: Secondary | ICD-10-CM | POA: Diagnosis not present

## 2020-04-25 DIAGNOSIS — I10 Essential (primary) hypertension: Secondary | ICD-10-CM | POA: Diagnosis not present

## 2020-04-25 DIAGNOSIS — K219 Gastro-esophageal reflux disease without esophagitis: Secondary | ICD-10-CM | POA: Diagnosis not present

## 2020-04-25 DIAGNOSIS — E785 Hyperlipidemia, unspecified: Secondary | ICD-10-CM | POA: Diagnosis not present

## 2020-04-25 DIAGNOSIS — E119 Type 2 diabetes mellitus without complications: Secondary | ICD-10-CM | POA: Diagnosis not present

## 2021-02-21 DIAGNOSIS — E119 Type 2 diabetes mellitus without complications: Secondary | ICD-10-CM

## 2021-02-21 HISTORY — DX: Type 2 diabetes mellitus without complications: E11.9

## 2021-03-03 DIAGNOSIS — E1169 Type 2 diabetes mellitus with other specified complication: Secondary | ICD-10-CM | POA: Diagnosis not present

## 2021-03-03 DIAGNOSIS — F419 Anxiety disorder, unspecified: Secondary | ICD-10-CM | POA: Diagnosis not present

## 2021-03-03 DIAGNOSIS — I10 Essential (primary) hypertension: Secondary | ICD-10-CM | POA: Diagnosis not present

## 2021-03-03 DIAGNOSIS — E785 Hyperlipidemia, unspecified: Secondary | ICD-10-CM | POA: Diagnosis not present

## 2021-03-19 DIAGNOSIS — I83891 Varicose veins of right lower extremities with other complications: Secondary | ICD-10-CM | POA: Diagnosis not present

## 2021-05-04 DIAGNOSIS — I83891 Varicose veins of right lower extremities with other complications: Secondary | ICD-10-CM | POA: Diagnosis not present

## 2021-05-05 DIAGNOSIS — I83891 Varicose veins of right lower extremities with other complications: Secondary | ICD-10-CM | POA: Diagnosis not present

## 2021-05-06 DIAGNOSIS — I83891 Varicose veins of right lower extremities with other complications: Secondary | ICD-10-CM | POA: Diagnosis not present

## 2021-05-13 DIAGNOSIS — I83891 Varicose veins of right lower extremities with other complications: Secondary | ICD-10-CM | POA: Diagnosis not present

## 2021-05-20 DIAGNOSIS — I83891 Varicose veins of right lower extremities with other complications: Secondary | ICD-10-CM | POA: Diagnosis not present

## 2021-06-09 DIAGNOSIS — M5432 Sciatica, left side: Secondary | ICD-10-CM | POA: Diagnosis not present

## 2021-06-09 DIAGNOSIS — I10 Essential (primary) hypertension: Secondary | ICD-10-CM | POA: Diagnosis not present

## 2021-06-23 DIAGNOSIS — I83891 Varicose veins of right lower extremities with other complications: Secondary | ICD-10-CM | POA: Diagnosis not present

## 2021-06-29 ENCOUNTER — Other Ambulatory Visit: Payer: Self-pay | Admitting: Family Medicine

## 2021-06-29 DIAGNOSIS — M5432 Sciatica, left side: Secondary | ICD-10-CM

## 2021-06-30 DIAGNOSIS — I83891 Varicose veins of right lower extremities with other complications: Secondary | ICD-10-CM | POA: Diagnosis not present

## 2021-07-15 ENCOUNTER — Ambulatory Visit
Admission: RE | Admit: 2021-07-15 | Discharge: 2021-07-15 | Disposition: A | Discharge status: Home / Self Care | Payer: BC Managed Care – PPO | Source: Ambulatory Visit | Attending: Family Medicine | Admitting: Family Medicine

## 2021-07-15 ENCOUNTER — Other Ambulatory Visit: Payer: Self-pay

## 2021-07-15 DIAGNOSIS — M545 Low back pain, unspecified: Secondary | ICD-10-CM | POA: Diagnosis not present

## 2021-07-15 DIAGNOSIS — M5432 Sciatica, left side: Secondary | ICD-10-CM

## 2021-07-22 DIAGNOSIS — M5126 Other intervertebral disc displacement, lumbar region: Secondary | ICD-10-CM | POA: Diagnosis not present

## 2021-07-22 DIAGNOSIS — I10 Essential (primary) hypertension: Secondary | ICD-10-CM | POA: Diagnosis not present

## 2021-07-22 DIAGNOSIS — Z6834 Body mass index (BMI) 34.0-34.9, adult: Secondary | ICD-10-CM | POA: Diagnosis not present

## 2021-07-23 DIAGNOSIS — I83891 Varicose veins of right lower extremities with other complications: Secondary | ICD-10-CM | POA: Diagnosis not present

## 2021-07-27 DIAGNOSIS — M5126 Other intervertebral disc displacement, lumbar region: Secondary | ICD-10-CM | POA: Diagnosis not present

## 2021-07-29 DIAGNOSIS — M5416 Radiculopathy, lumbar region: Secondary | ICD-10-CM | POA: Diagnosis not present

## 2021-07-31 DIAGNOSIS — I83891 Varicose veins of right lower extremities with other complications: Secondary | ICD-10-CM | POA: Diagnosis not present

## 2021-08-03 DIAGNOSIS — M5432 Sciatica, left side: Secondary | ICD-10-CM | POA: Diagnosis not present

## 2021-08-13 DIAGNOSIS — I83891 Varicose veins of right lower extremities with other complications: Secondary | ICD-10-CM | POA: Diagnosis not present

## 2021-08-19 DIAGNOSIS — M5416 Radiculopathy, lumbar region: Secondary | ICD-10-CM | POA: Diagnosis not present

## 2021-09-02 DIAGNOSIS — F411 Generalized anxiety disorder: Secondary | ICD-10-CM | POA: Diagnosis not present

## 2021-09-02 DIAGNOSIS — Z79899 Other long term (current) drug therapy: Secondary | ICD-10-CM | POA: Diagnosis not present

## 2021-09-02 DIAGNOSIS — F1420 Cocaine dependence, uncomplicated: Secondary | ICD-10-CM | POA: Diagnosis not present

## 2021-09-02 DIAGNOSIS — F1220 Cannabis dependence, uncomplicated: Secondary | ICD-10-CM | POA: Diagnosis not present

## 2021-09-03 DIAGNOSIS — F1220 Cannabis dependence, uncomplicated: Secondary | ICD-10-CM | POA: Diagnosis not present

## 2021-09-03 DIAGNOSIS — F1020 Alcohol dependence, uncomplicated: Secondary | ICD-10-CM | POA: Diagnosis not present

## 2021-09-03 DIAGNOSIS — Z79899 Other long term (current) drug therapy: Secondary | ICD-10-CM | POA: Diagnosis not present

## 2021-09-03 DIAGNOSIS — F411 Generalized anxiety disorder: Secondary | ICD-10-CM | POA: Diagnosis not present

## 2021-09-18 DIAGNOSIS — F1020 Alcohol dependence, uncomplicated: Secondary | ICD-10-CM | POA: Diagnosis not present

## 2021-09-20 DIAGNOSIS — F411 Generalized anxiety disorder: Secondary | ICD-10-CM | POA: Diagnosis not present

## 2021-09-20 DIAGNOSIS — F1220 Cannabis dependence, uncomplicated: Secondary | ICD-10-CM | POA: Diagnosis not present

## 2021-09-20 DIAGNOSIS — Z79899 Other long term (current) drug therapy: Secondary | ICD-10-CM | POA: Diagnosis not present

## 2021-09-20 DIAGNOSIS — F1020 Alcohol dependence, uncomplicated: Secondary | ICD-10-CM | POA: Diagnosis not present

## 2021-09-20 DIAGNOSIS — F1420 Cocaine dependence, uncomplicated: Secondary | ICD-10-CM | POA: Diagnosis not present

## 2021-09-21 DIAGNOSIS — F411 Generalized anxiety disorder: Secondary | ICD-10-CM | POA: Diagnosis not present

## 2021-09-21 DIAGNOSIS — Z79899 Other long term (current) drug therapy: Secondary | ICD-10-CM | POA: Diagnosis not present

## 2021-09-21 DIAGNOSIS — F1020 Alcohol dependence, uncomplicated: Secondary | ICD-10-CM | POA: Diagnosis not present

## 2021-09-21 DIAGNOSIS — F1220 Cannabis dependence, uncomplicated: Secondary | ICD-10-CM | POA: Diagnosis not present

## 2021-09-21 DIAGNOSIS — F1420 Cocaine dependence, uncomplicated: Secondary | ICD-10-CM | POA: Diagnosis not present

## 2021-09-22 DIAGNOSIS — F1020 Alcohol dependence, uncomplicated: Secondary | ICD-10-CM | POA: Diagnosis not present

## 2021-09-23 DIAGNOSIS — I10 Essential (primary) hypertension: Secondary | ICD-10-CM | POA: Diagnosis not present

## 2021-09-23 DIAGNOSIS — E119 Type 2 diabetes mellitus without complications: Secondary | ICD-10-CM | POA: Diagnosis not present

## 2021-09-23 DIAGNOSIS — K219 Gastro-esophageal reflux disease without esophagitis: Secondary | ICD-10-CM | POA: Diagnosis not present

## 2021-09-23 DIAGNOSIS — F1020 Alcohol dependence, uncomplicated: Secondary | ICD-10-CM | POA: Diagnosis not present

## 2021-09-24 DIAGNOSIS — F1020 Alcohol dependence, uncomplicated: Secondary | ICD-10-CM | POA: Diagnosis not present

## 2021-09-25 DIAGNOSIS — F1020 Alcohol dependence, uncomplicated: Secondary | ICD-10-CM | POA: Diagnosis not present

## 2021-09-27 DIAGNOSIS — F1020 Alcohol dependence, uncomplicated: Secondary | ICD-10-CM | POA: Diagnosis not present

## 2021-09-28 DIAGNOSIS — F1020 Alcohol dependence, uncomplicated: Secondary | ICD-10-CM | POA: Diagnosis not present

## 2021-09-29 DIAGNOSIS — F1020 Alcohol dependence, uncomplicated: Secondary | ICD-10-CM | POA: Diagnosis not present

## 2021-09-30 DIAGNOSIS — F1020 Alcohol dependence, uncomplicated: Secondary | ICD-10-CM | POA: Diagnosis not present

## 2021-10-01 DIAGNOSIS — F1020 Alcohol dependence, uncomplicated: Secondary | ICD-10-CM | POA: Diagnosis not present

## 2021-10-02 DIAGNOSIS — F1020 Alcohol dependence, uncomplicated: Secondary | ICD-10-CM | POA: Diagnosis not present

## 2021-10-07 DIAGNOSIS — M5416 Radiculopathy, lumbar region: Secondary | ICD-10-CM | POA: Diagnosis not present

## 2021-10-07 DIAGNOSIS — I10 Essential (primary) hypertension: Secondary | ICD-10-CM | POA: Diagnosis not present

## 2021-10-07 DIAGNOSIS — M5126 Other intervertebral disc displacement, lumbar region: Secondary | ICD-10-CM | POA: Diagnosis not present

## 2021-10-07 DIAGNOSIS — Z6836 Body mass index (BMI) 36.0-36.9, adult: Secondary | ICD-10-CM | POA: Diagnosis not present

## 2021-10-08 DIAGNOSIS — Z6838 Body mass index (BMI) 38.0-38.9, adult: Secondary | ICD-10-CM | POA: Diagnosis not present

## 2021-10-08 DIAGNOSIS — I10 Essential (primary) hypertension: Secondary | ICD-10-CM | POA: Diagnosis not present

## 2021-10-08 DIAGNOSIS — Z0000 Encounter for general adult medical examination without abnormal findings: Secondary | ICD-10-CM | POA: Diagnosis not present

## 2021-10-08 DIAGNOSIS — R030 Elevated blood-pressure reading, without diagnosis of hypertension: Secondary | ICD-10-CM | POA: Diagnosis not present

## 2021-10-08 DIAGNOSIS — Z125 Encounter for screening for malignant neoplasm of prostate: Secondary | ICD-10-CM | POA: Diagnosis not present

## 2021-10-08 DIAGNOSIS — Z1159 Encounter for screening for other viral diseases: Secondary | ICD-10-CM | POA: Diagnosis not present

## 2021-10-08 DIAGNOSIS — Z131 Encounter for screening for diabetes mellitus: Secondary | ICD-10-CM | POA: Diagnosis not present

## 2021-10-12 DIAGNOSIS — E119 Type 2 diabetes mellitus without complications: Secondary | ICD-10-CM | POA: Diagnosis not present

## 2021-10-12 DIAGNOSIS — E7800 Pure hypercholesterolemia, unspecified: Secondary | ICD-10-CM | POA: Diagnosis not present

## 2021-10-12 DIAGNOSIS — E669 Obesity, unspecified: Secondary | ICD-10-CM | POA: Diagnosis not present

## 2021-10-12 DIAGNOSIS — E1169 Type 2 diabetes mellitus with other specified complication: Secondary | ICD-10-CM | POA: Diagnosis not present

## 2021-10-12 DIAGNOSIS — D649 Anemia, unspecified: Secondary | ICD-10-CM | POA: Diagnosis not present

## 2021-10-12 DIAGNOSIS — K219 Gastro-esophageal reflux disease without esophagitis: Secondary | ICD-10-CM | POA: Diagnosis not present

## 2021-10-12 DIAGNOSIS — F419 Anxiety disorder, unspecified: Secondary | ICD-10-CM | POA: Diagnosis not present

## 2021-10-12 DIAGNOSIS — F1420 Cocaine dependence, uncomplicated: Secondary | ICD-10-CM | POA: Diagnosis not present

## 2021-10-12 DIAGNOSIS — G4700 Insomnia, unspecified: Secondary | ICD-10-CM | POA: Diagnosis not present

## 2021-10-12 DIAGNOSIS — F1010 Alcohol abuse, uncomplicated: Secondary | ICD-10-CM | POA: Diagnosis not present

## 2021-10-12 DIAGNOSIS — I10 Essential (primary) hypertension: Secondary | ICD-10-CM | POA: Diagnosis not present

## 2021-10-12 DIAGNOSIS — F1220 Cannabis dependence, uncomplicated: Secondary | ICD-10-CM | POA: Diagnosis not present

## 2021-10-15 DIAGNOSIS — E669 Obesity, unspecified: Secondary | ICD-10-CM | POA: Diagnosis not present

## 2021-10-15 DIAGNOSIS — G4700 Insomnia, unspecified: Secondary | ICD-10-CM | POA: Diagnosis not present

## 2021-10-15 DIAGNOSIS — K219 Gastro-esophageal reflux disease without esophagitis: Secondary | ICD-10-CM | POA: Diagnosis not present

## 2021-10-15 DIAGNOSIS — I10 Essential (primary) hypertension: Secondary | ICD-10-CM | POA: Diagnosis not present

## 2021-10-15 DIAGNOSIS — F1010 Alcohol abuse, uncomplicated: Secondary | ICD-10-CM | POA: Diagnosis not present

## 2021-10-15 DIAGNOSIS — F419 Anxiety disorder, unspecified: Secondary | ICD-10-CM | POA: Diagnosis not present

## 2021-10-15 DIAGNOSIS — F1420 Cocaine dependence, uncomplicated: Secondary | ICD-10-CM | POA: Diagnosis not present

## 2021-10-15 DIAGNOSIS — F1220 Cannabis dependence, uncomplicated: Secondary | ICD-10-CM | POA: Diagnosis not present

## 2021-10-15 DIAGNOSIS — D649 Anemia, unspecified: Secondary | ICD-10-CM | POA: Diagnosis not present

## 2021-10-17 DIAGNOSIS — K625 Hemorrhage of anus and rectum: Secondary | ICD-10-CM | POA: Diagnosis not present

## 2021-10-17 DIAGNOSIS — D509 Iron deficiency anemia, unspecified: Secondary | ICD-10-CM | POA: Diagnosis not present

## 2021-10-17 DIAGNOSIS — Z1211 Encounter for screening for malignant neoplasm of colon: Secondary | ICD-10-CM | POA: Diagnosis not present

## 2021-10-19 DIAGNOSIS — F1220 Cannabis dependence, uncomplicated: Secondary | ICD-10-CM | POA: Diagnosis not present

## 2021-10-19 DIAGNOSIS — D649 Anemia, unspecified: Secondary | ICD-10-CM | POA: Diagnosis not present

## 2021-10-19 DIAGNOSIS — F1420 Cocaine dependence, uncomplicated: Secondary | ICD-10-CM | POA: Diagnosis not present

## 2021-10-19 DIAGNOSIS — G4700 Insomnia, unspecified: Secondary | ICD-10-CM | POA: Diagnosis not present

## 2021-10-19 DIAGNOSIS — F419 Anxiety disorder, unspecified: Secondary | ICD-10-CM | POA: Diagnosis not present

## 2021-10-19 DIAGNOSIS — F1010 Alcohol abuse, uncomplicated: Secondary | ICD-10-CM | POA: Diagnosis not present

## 2021-10-19 DIAGNOSIS — E669 Obesity, unspecified: Secondary | ICD-10-CM | POA: Diagnosis not present

## 2021-10-19 DIAGNOSIS — K219 Gastro-esophageal reflux disease without esophagitis: Secondary | ICD-10-CM | POA: Diagnosis not present

## 2021-10-19 DIAGNOSIS — I10 Essential (primary) hypertension: Secondary | ICD-10-CM | POA: Diagnosis not present

## 2021-10-20 DIAGNOSIS — D649 Anemia, unspecified: Secondary | ICD-10-CM | POA: Diagnosis not present

## 2021-10-20 DIAGNOSIS — K219 Gastro-esophageal reflux disease without esophagitis: Secondary | ICD-10-CM | POA: Diagnosis not present

## 2021-10-20 DIAGNOSIS — E669 Obesity, unspecified: Secondary | ICD-10-CM | POA: Diagnosis not present

## 2021-10-20 DIAGNOSIS — F1220 Cannabis dependence, uncomplicated: Secondary | ICD-10-CM | POA: Diagnosis not present

## 2021-10-20 DIAGNOSIS — F1010 Alcohol abuse, uncomplicated: Secondary | ICD-10-CM | POA: Diagnosis not present

## 2021-10-20 DIAGNOSIS — F1420 Cocaine dependence, uncomplicated: Secondary | ICD-10-CM | POA: Diagnosis not present

## 2021-10-20 DIAGNOSIS — I10 Essential (primary) hypertension: Secondary | ICD-10-CM | POA: Diagnosis not present

## 2021-10-20 DIAGNOSIS — G4700 Insomnia, unspecified: Secondary | ICD-10-CM | POA: Diagnosis not present

## 2021-10-20 DIAGNOSIS — F419 Anxiety disorder, unspecified: Secondary | ICD-10-CM | POA: Diagnosis not present

## 2021-10-22 DIAGNOSIS — Z760 Encounter for issue of repeat prescription: Secondary | ICD-10-CM | POA: Diagnosis not present

## 2021-10-22 DIAGNOSIS — E119 Type 2 diabetes mellitus without complications: Secondary | ICD-10-CM | POA: Diagnosis not present

## 2021-10-22 DIAGNOSIS — Z6837 Body mass index (BMI) 37.0-37.9, adult: Secondary | ICD-10-CM | POA: Diagnosis not present

## 2021-10-22 DIAGNOSIS — Z0130 Encounter for examination of blood pressure without abnormal findings: Secondary | ICD-10-CM | POA: Diagnosis not present

## 2021-10-27 DIAGNOSIS — M5116 Intervertebral disc disorders with radiculopathy, lumbar region: Secondary | ICD-10-CM | POA: Diagnosis not present

## 2021-10-27 DIAGNOSIS — K219 Gastro-esophageal reflux disease without esophagitis: Secondary | ICD-10-CM | POA: Diagnosis not present

## 2021-10-27 DIAGNOSIS — D649 Anemia, unspecified: Secondary | ICD-10-CM | POA: Diagnosis not present

## 2021-10-27 DIAGNOSIS — G4700 Insomnia, unspecified: Secondary | ICD-10-CM | POA: Diagnosis not present

## 2021-10-27 DIAGNOSIS — F1220 Cannabis dependence, uncomplicated: Secondary | ICD-10-CM | POA: Diagnosis not present

## 2021-10-27 DIAGNOSIS — I10 Essential (primary) hypertension: Secondary | ICD-10-CM | POA: Diagnosis not present

## 2021-10-27 DIAGNOSIS — F419 Anxiety disorder, unspecified: Secondary | ICD-10-CM | POA: Diagnosis not present

## 2021-10-27 DIAGNOSIS — E669 Obesity, unspecified: Secondary | ICD-10-CM | POA: Diagnosis not present

## 2021-10-27 DIAGNOSIS — F1010 Alcohol abuse, uncomplicated: Secondary | ICD-10-CM | POA: Diagnosis not present

## 2021-10-27 DIAGNOSIS — F1420 Cocaine dependence, uncomplicated: Secondary | ICD-10-CM | POA: Diagnosis not present

## 2021-10-29 DIAGNOSIS — E669 Obesity, unspecified: Secondary | ICD-10-CM | POA: Diagnosis not present

## 2021-10-29 DIAGNOSIS — F419 Anxiety disorder, unspecified: Secondary | ICD-10-CM | POA: Diagnosis not present

## 2021-10-29 DIAGNOSIS — F1220 Cannabis dependence, uncomplicated: Secondary | ICD-10-CM | POA: Diagnosis not present

## 2021-10-29 DIAGNOSIS — G4700 Insomnia, unspecified: Secondary | ICD-10-CM | POA: Diagnosis not present

## 2021-10-29 DIAGNOSIS — K219 Gastro-esophageal reflux disease without esophagitis: Secondary | ICD-10-CM | POA: Diagnosis not present

## 2021-10-29 DIAGNOSIS — F1010 Alcohol abuse, uncomplicated: Secondary | ICD-10-CM | POA: Diagnosis not present

## 2021-10-29 DIAGNOSIS — I10 Essential (primary) hypertension: Secondary | ICD-10-CM | POA: Diagnosis not present

## 2021-10-29 DIAGNOSIS — D649 Anemia, unspecified: Secondary | ICD-10-CM | POA: Diagnosis not present

## 2021-10-29 DIAGNOSIS — F1420 Cocaine dependence, uncomplicated: Secondary | ICD-10-CM | POA: Diagnosis not present

## 2021-11-02 DIAGNOSIS — G4700 Insomnia, unspecified: Secondary | ICD-10-CM | POA: Diagnosis not present

## 2021-11-02 DIAGNOSIS — F419 Anxiety disorder, unspecified: Secondary | ICD-10-CM | POA: Diagnosis not present

## 2021-11-02 DIAGNOSIS — K219 Gastro-esophageal reflux disease without esophagitis: Secondary | ICD-10-CM | POA: Diagnosis not present

## 2021-11-02 DIAGNOSIS — F1010 Alcohol abuse, uncomplicated: Secondary | ICD-10-CM | POA: Diagnosis not present

## 2021-11-02 DIAGNOSIS — I10 Essential (primary) hypertension: Secondary | ICD-10-CM | POA: Diagnosis not present

## 2021-11-02 DIAGNOSIS — E669 Obesity, unspecified: Secondary | ICD-10-CM | POA: Diagnosis not present

## 2021-11-02 DIAGNOSIS — D649 Anemia, unspecified: Secondary | ICD-10-CM | POA: Diagnosis not present

## 2021-11-02 DIAGNOSIS — F1420 Cocaine dependence, uncomplicated: Secondary | ICD-10-CM | POA: Diagnosis not present

## 2021-11-02 DIAGNOSIS — F1220 Cannabis dependence, uncomplicated: Secondary | ICD-10-CM | POA: Diagnosis not present

## 2021-11-03 DIAGNOSIS — F1420 Cocaine dependence, uncomplicated: Secondary | ICD-10-CM | POA: Diagnosis not present

## 2021-11-03 DIAGNOSIS — G4700 Insomnia, unspecified: Secondary | ICD-10-CM | POA: Diagnosis not present

## 2021-11-03 DIAGNOSIS — E669 Obesity, unspecified: Secondary | ICD-10-CM | POA: Diagnosis not present

## 2021-11-03 DIAGNOSIS — F1220 Cannabis dependence, uncomplicated: Secondary | ICD-10-CM | POA: Diagnosis not present

## 2021-11-03 DIAGNOSIS — F1010 Alcohol abuse, uncomplicated: Secondary | ICD-10-CM | POA: Diagnosis not present

## 2021-11-03 DIAGNOSIS — I10 Essential (primary) hypertension: Secondary | ICD-10-CM | POA: Diagnosis not present

## 2021-11-03 DIAGNOSIS — K219 Gastro-esophageal reflux disease without esophagitis: Secondary | ICD-10-CM | POA: Diagnosis not present

## 2021-11-03 DIAGNOSIS — F419 Anxiety disorder, unspecified: Secondary | ICD-10-CM | POA: Diagnosis not present

## 2021-11-03 DIAGNOSIS — D649 Anemia, unspecified: Secondary | ICD-10-CM | POA: Diagnosis not present

## 2021-11-04 DIAGNOSIS — M5116 Intervertebral disc disorders with radiculopathy, lumbar region: Secondary | ICD-10-CM | POA: Diagnosis not present

## 2021-11-05 DIAGNOSIS — E669 Obesity, unspecified: Secondary | ICD-10-CM | POA: Diagnosis not present

## 2021-11-05 DIAGNOSIS — F1220 Cannabis dependence, uncomplicated: Secondary | ICD-10-CM | POA: Diagnosis not present

## 2021-11-05 DIAGNOSIS — F1010 Alcohol abuse, uncomplicated: Secondary | ICD-10-CM | POA: Diagnosis not present

## 2021-11-05 DIAGNOSIS — D649 Anemia, unspecified: Secondary | ICD-10-CM | POA: Diagnosis not present

## 2021-11-05 DIAGNOSIS — F419 Anxiety disorder, unspecified: Secondary | ICD-10-CM | POA: Diagnosis not present

## 2021-11-05 DIAGNOSIS — K219 Gastro-esophageal reflux disease without esophagitis: Secondary | ICD-10-CM | POA: Diagnosis not present

## 2021-11-05 DIAGNOSIS — I10 Essential (primary) hypertension: Secondary | ICD-10-CM | POA: Diagnosis not present

## 2021-11-05 DIAGNOSIS — F1420 Cocaine dependence, uncomplicated: Secondary | ICD-10-CM | POA: Diagnosis not present

## 2021-11-05 DIAGNOSIS — G4700 Insomnia, unspecified: Secondary | ICD-10-CM | POA: Diagnosis not present

## 2021-11-09 DIAGNOSIS — E669 Obesity, unspecified: Secondary | ICD-10-CM | POA: Diagnosis not present

## 2021-11-09 DIAGNOSIS — G4700 Insomnia, unspecified: Secondary | ICD-10-CM | POA: Diagnosis not present

## 2021-11-09 DIAGNOSIS — F1010 Alcohol abuse, uncomplicated: Secondary | ICD-10-CM | POA: Diagnosis not present

## 2021-11-09 DIAGNOSIS — K219 Gastro-esophageal reflux disease without esophagitis: Secondary | ICD-10-CM | POA: Diagnosis not present

## 2021-11-09 DIAGNOSIS — F1420 Cocaine dependence, uncomplicated: Secondary | ICD-10-CM | POA: Diagnosis not present

## 2021-11-09 DIAGNOSIS — I10 Essential (primary) hypertension: Secondary | ICD-10-CM | POA: Diagnosis not present

## 2021-11-09 DIAGNOSIS — D649 Anemia, unspecified: Secondary | ICD-10-CM | POA: Diagnosis not present

## 2021-11-09 DIAGNOSIS — F1220 Cannabis dependence, uncomplicated: Secondary | ICD-10-CM | POA: Diagnosis not present

## 2021-11-09 DIAGNOSIS — F419 Anxiety disorder, unspecified: Secondary | ICD-10-CM | POA: Diagnosis not present

## 2021-11-10 DIAGNOSIS — F1420 Cocaine dependence, uncomplicated: Secondary | ICD-10-CM | POA: Diagnosis not present

## 2021-11-10 DIAGNOSIS — F1010 Alcohol abuse, uncomplicated: Secondary | ICD-10-CM | POA: Diagnosis not present

## 2021-11-10 DIAGNOSIS — D649 Anemia, unspecified: Secondary | ICD-10-CM | POA: Diagnosis not present

## 2021-11-10 DIAGNOSIS — F1220 Cannabis dependence, uncomplicated: Secondary | ICD-10-CM | POA: Diagnosis not present

## 2021-11-10 DIAGNOSIS — F419 Anxiety disorder, unspecified: Secondary | ICD-10-CM | POA: Diagnosis not present

## 2021-11-10 DIAGNOSIS — I10 Essential (primary) hypertension: Secondary | ICD-10-CM | POA: Diagnosis not present

## 2021-11-10 DIAGNOSIS — E669 Obesity, unspecified: Secondary | ICD-10-CM | POA: Diagnosis not present

## 2021-11-10 DIAGNOSIS — K219 Gastro-esophageal reflux disease without esophagitis: Secondary | ICD-10-CM | POA: Diagnosis not present

## 2021-11-10 DIAGNOSIS — G4700 Insomnia, unspecified: Secondary | ICD-10-CM | POA: Diagnosis not present

## 2021-11-11 DIAGNOSIS — M5116 Intervertebral disc disorders with radiculopathy, lumbar region: Secondary | ICD-10-CM | POA: Diagnosis not present

## 2021-11-17 DIAGNOSIS — Z1211 Encounter for screening for malignant neoplasm of colon: Secondary | ICD-10-CM | POA: Diagnosis not present

## 2021-11-17 DIAGNOSIS — K625 Hemorrhage of anus and rectum: Secondary | ICD-10-CM | POA: Diagnosis not present

## 2021-11-29 DIAGNOSIS — K635 Polyp of colon: Secondary | ICD-10-CM | POA: Diagnosis not present

## 2022-03-27 ENCOUNTER — Emergency Department (HOSPITAL_COMMUNITY)
Admission: EM | Admit: 2022-03-27 | Discharge: 2022-03-29 | Disposition: A | Discharge status: Home / Self Care | Payer: BC Managed Care – PPO | Attending: Emergency Medicine | Admitting: Emergency Medicine

## 2022-03-27 DIAGNOSIS — Z046 Encounter for general psychiatric examination, requested by authority: Secondary | ICD-10-CM | POA: Diagnosis not present

## 2022-03-27 DIAGNOSIS — Z79899 Other long term (current) drug therapy: Secondary | ICD-10-CM | POA: Insufficient documentation

## 2022-03-27 DIAGNOSIS — F4324 Adjustment disorder with disturbance of conduct: Secondary | ICD-10-CM

## 2022-03-27 DIAGNOSIS — R45851 Suicidal ideations: Secondary | ICD-10-CM | POA: Diagnosis not present

## 2022-03-27 DIAGNOSIS — I10 Essential (primary) hypertension: Secondary | ICD-10-CM | POA: Diagnosis not present

## 2022-03-27 DIAGNOSIS — F1994 Other psychoactive substance use, unspecified with psychoactive substance-induced mood disorder: Secondary | ICD-10-CM | POA: Diagnosis not present

## 2022-03-27 DIAGNOSIS — Z20822 Contact with and (suspected) exposure to covid-19: Secondary | ICD-10-CM | POA: Diagnosis not present

## 2022-03-27 LAB — COMPREHENSIVE METABOLIC PANEL
ALT: 24 U/L (ref 0–44)
AST: 21 U/L (ref 15–41)
Albumin: 4 g/dL (ref 3.5–5.0)
Alkaline Phosphatase: 79 U/L (ref 38–126)
Anion gap: 9 (ref 5–15)
BUN: 18 mg/dL (ref 6–20)
CO2: 24 mmol/L (ref 22–32)
Calcium: 9.3 mg/dL (ref 8.9–10.3)
Chloride: 107 mmol/L (ref 98–111)
Creatinine, Ser: 1.08 mg/dL (ref 0.61–1.24)
GFR, Estimated: >60 mL/min (ref >60)
Glucose, Bld: 115 mg/dL — ABNORMAL HIGH (ref 70–99)
Potassium: 3.7 mmol/L (ref 3.5–5.1)
Sodium: 140 mmol/L (ref 135–145)
Total Bilirubin: 0.3 mg/dL (ref 0.3–1.2)
Total Protein: 7.7 g/dL (ref 6.5–8.1)

## 2022-03-27 LAB — CBC
HCT: 41.6 % (ref 39.0–52.0)
Hemoglobin: 12.9 g/dL — ABNORMAL LOW (ref 13.0–17.0)
MCH: 23.5 pg — ABNORMAL LOW (ref 26.0–34.0)
MCHC: 31 g/dL (ref 30.0–36.0)
MCV: 75.9 fL — ABNORMAL LOW (ref 80.0–100.0)
Platelets: 332 10*3/uL (ref 150–400)
RBC: 5.48 MIL/uL (ref 4.22–5.81)
RDW: 20.9 % — ABNORMAL HIGH (ref 11.5–15.5)
WBC: 9.2 10*3/uL (ref 4.0–10.5)
nRBC: 0 % (ref 0.0–0.2)

## 2022-03-27 LAB — ETHANOL: Alcohol, Ethyl (B): 16 mg/dL — ABNORMAL HIGH (ref <10)

## 2022-03-27 LAB — RAPID URINE DRUG SCREEN, HOSP PERFORMED
Amphetamines: NOT DETECTED
Barbiturates: NOT DETECTED
Benzodiazepines: POSITIVE — AB
Cocaine: POSITIVE — AB
Opiates: NOT DETECTED
Tetrahydrocannabinol: POSITIVE — AB

## 2022-03-27 LAB — RESP PANEL BY RT-PCR (FLU A&B, COVID) ARPGX2
Influenza A by PCR: NEGATIVE
Influenza B by PCR: NEGATIVE
SARS Coronavirus 2 by RT PCR: NEGATIVE

## 2022-03-27 MED ORDER — LOSARTAN POTASSIUM 25 MG PO TABS
50.0000 mg | ORAL_TABLET | Freq: Every day | ORAL | Status: DC
  Administered 2022-03-27 – 2022-03-29 (×3): 50 mg via ORAL
  Filled 2022-03-27 (×3): qty 2

## 2022-03-27 MED ORDER — ONDANSETRON HCL 4 MG PO TABS: 4.0000 mg | ORAL_TABLET | Freq: Three times a day (TID) | ORAL | Status: DC | PRN

## 2022-03-27 MED ORDER — ZOLPIDEM TARTRATE 5 MG PO TABS
5.0000 mg | ORAL_TABLET | Freq: Every evening | ORAL | Status: DC | PRN
  Administered 2022-03-28: 5 mg via ORAL
  Filled 2022-03-27: qty 1

## 2022-03-27 MED ORDER — LORAZEPAM 1 MG PO TABS
1.0000 mg | ORAL_TABLET | ORAL | Status: AC | PRN
  Administered 2022-03-28: 1 mg via ORAL
  Filled 2022-03-27: qty 1

## 2022-03-27 MED ORDER — ZIPRASIDONE MESYLATE 20 MG IM SOLR
20.0000 mg | Freq: Two times a day (BID) | INTRAMUSCULAR | Status: DC | PRN
  Administered 2022-03-27: 20 mg via INTRAMUSCULAR
  Filled 2022-03-27: qty 20

## 2022-03-27 MED ORDER — ACETAMINOPHEN 325 MG PO TABS: 650.0000 mg | ORAL_TABLET | ORAL | Status: DC | PRN

## 2022-03-27 MED ORDER — STERILE WATER FOR INJECTION IJ SOLN
INTRAMUSCULAR | Status: AC
  Filled 2022-03-27: qty 10

## 2022-03-27 MED ORDER — ALUM & MAG HYDROXIDE-SIMETH 200-200-20 MG/5ML PO SUSP: 30.0000 mL | Freq: Four times a day (QID) | ORAL | Status: DC | PRN

## 2022-03-27 NOTE — Consult Note (Signed)
[facility] ED ASSESSMENT   Reason for Consult:  IVC Referring Physician:  Derwood Kaplan Patient Identification: Terrence Conrad MRN:  [id number] ED Chief Complaint: Adjustment disorder with disturbance of conduct  Diagnosis:  Principal Problem:   Adjustment disorder with disturbance of conduct Active Problems:   Involuntary commitment   Substance induced mood disorder [facility])   ED Assessment Time Calculation: No data recorded  Subjective:   Terrence Conrad is a 51 y.o. male patient who presented to [facility] with law enforcement under IVC. Patient petitioned for IVC by his wife Terrence Conrad [phone]. Per IVC, "Respondent and his wife went to marriage counseling this morning. Wife disclosed that she had been approved for a new apartment. Respondent stormed out. The therapist followed him at which time respondent stated he didn't want to be on earth anymore. While driving home, respondent attempted to grab pistol that was located in the center console. Petitioner was able to get it away from him. He talked about saying goodbye to the kids and going to be with his Mom and Dad.When they arrived home he got the gun away from her, went in the house and possibly retrieved the gun from the safe as it is currently missing, and drove off. Shortly after tow police arrived on a welfare check called in by the therapist. He is currently mobile and danger to self and others."  HPI:   Patient located in a hall bed. He requests to speak in a private room. Patient escorted to room 1 by tech and this provider after discussing with nurse. On evaluation, patient is alert and oriented x 4. He is neatly groomed. Speech is clear and coherent, increased volume. Patient reports his mood as angry. Affect is labile and restricted. He denies feelings of depression or sadness. He ruminates on being petitioned for IVC and becomes increasingly agitated. He makes several comments about his wife being "crazy" and "a narcissist." Patient adamantly  denies making any suicidal statements. However, he does state "all I said was that I was tired and that I wanted to go be with my mama and daddy." He states "I keep a gun in my car, If I wanted to kill myself I would have done it by now." Patient denies HI. He denies AVH. No indication that he is responding to internal stimuli. Patient denies use of marijuana, cocaine, and other substances. Reports use of alcohol yesterday. Denies that he has been drinking consisently since release from substance abuse treatment program. Denies a history of alcohol withdrawal symptoms. UDS + benzodiazepines, cocaine, and THC. BAL 16. Did not discuss UDS results with patient due to his lability.   Patient continued to become more agitated as the assessment continued. He stood up and threw a bottle of water at the wall. He then walked out of the room. Patient was followed by provider. Patient appeared to be returning to room, but as we passed the ambulance entrance the doors opened and the patient exited. Staff observed patient exiting and notified security. Patient followed by this provider and ED staff. Attempted to redirect patient multiple times. Patient returned to ED with security after several minutes.   On chart review, it is noted that the patient continued to display agressive behavior towards staff after returning to the ED and received Geodon 20 mg IM at 1949 per agitation protocol previously ordered by EDP.   Contacted the patient's wife/IVC petitioner, Terrence Conrad [phone], for collateral. Patient's wife recounts the information from the IVC. She states that made  a couple of comments at the counselor's office indicating that he no longer wanted to be around. She states at that time he did not specifically Conrad that he was going to kill himself. She reports that sometime after the events mentioned in the IVC, he sent her a text stating "I am just going off to be with my mom and dad." Parents are deceased. She  states that he also sent a text to his daughter stating that he was "[place] his mom." She states that to her knowledge the patient has never attempted suicide. She states that he has never been psychiatrically hospitalized. She reports that he did receive treatment for substance abuse at a facility in [place] and returned home in February 2023. She states that after approximately 3 months of returning home he began using marijuana and alcohol again. She states that she believes he may be using other substances, but she has not witnessed him using anything else. She reports that they have not found the gun missing from the safe, but states her son thinks that the patient may have sold it.    Past Psychiatric History: cocaine use, marijuana use, alcohol use. Denies a history of suicide attempts and inpatient psychiatric admissions. Reports residential substance abuse treatment for 5 months at a facility in [place] for the treatment of cocaine use and alcohol use. Returned home in February 2023.   Risk to Self or Others: Is the patient at risk to self? Yes Has the patient been a risk to self in the past 6 months? No Has the patient been a risk to self within the distant past? No Is the patient a risk to others? No Has the patient been a risk to others in the past 6 months? No Has the patient been a risk to others within the distant past? No  [place] Scale:   AIMS:  , , ,  ,   ASAM:    Substance Abuse:     Past Medical History:  Past Medical History:  Diagnosis Date   Hypertension    No past surgical history on file. Family History: No family history on file. Family Psychiatric  History: reports no known history Social History:  Social History   Substance and Sexual Activity  Alcohol Use Not Currently   Alcohol/week: 0.0 standard drinks of alcohol     Social History   Substance and Sexual Activity  Drug Use Never    Social History    Socioeconomic History   Marital status: Married    Spouse name: Not on file   Number of children: Not on file   Years of education: Not on file   Highest education level: Not on file  Occupational History   Not on file  Tobacco Use   Smoking status: Light Smoker   Smokeless tobacco: Never  Substance and Sexual Activity   Alcohol use: Not Currently    Alcohol/week: 0.0 standard drinks of alcohol   Drug use: Never   Sexual activity: Not on file  Other Topics Concern   Not on file  Social History Narrative   Not on file   Social Determinants of Health   Financial Resource Strain: Not on file  Food Insecurity: Not on file  Transportation Needs: Not on file  Physical Activity: Not on file  Stress: Not on file  Social Connections: Not on file   Additional Social History:    Allergies:  No Known Allergies  Labs:  Results for orders placed or performed during the hospital encounter of 03/27/22 (from the past 48 hour(s))  Comprehensive metabolic panel     Status: Abnormal   Collection Time: 03/27/22  1:59 PM  Result Value Ref Range   Sodium 140 135 - 145 mmol/L   Potassium 3.7 3.5 - 5.1 mmol/L   Chloride 107 98 - 111 mmol/L   CO2 24 22 - 32 mmol/L   Glucose, Bld 115 (H) 70 - 99 mg/dL    Comment: Glucose reference range applies only to samples taken after fasting for at least 8 hours.   BUN 18 6 - 20 mg/dL   Creatinine, Ser 1.611.08 0.61 - 1.24 mg/dL   Calcium 9.3 8.9 - 09.610.3 mg/dL   Total Protein 7.7 6.5 - 8.1 g/dL   Albumin 4.0 3.5 - 5.0 g/dL   AST 21 15 - 41 U/L   ALT 24 0 - 44 U/L   Alkaline Phosphatase 79 38 - 126 U/L   Total Bilirubin 0.3 0.3 - 1.2 mg/dL   GFR, Estimated >04>60 >54>60 mL/min    Comment: (NOTE) Calculated using the CKD-EPI Creatinine Equation (2021)    Anion gap 9 5 - 15    Comment: Performed at [facility], [address] [place], [place] [address]  Ethanol     Status: Abnormal   Collection Time: 03/27/22  1:59 PM  Result Value  Ref Range   Alcohol, Ethyl (B) 16 (H) <10 mg/dL    Comment: (NOTE) Lowest detectable limit for serum alcohol is 10 mg/dL.  For medical purposes only. Performed at [facility], [address] [place], [place] [address]   cbc     Status: Abnormal   Collection Time: 03/27/22  1:59 PM  Result Value Ref Range   WBC 9.2 4.0 - 10.5 K/uL   RBC 5.48 4.22 - 5.81 MIL/uL   Hemoglobin 12.9 (L) 13.0 - 17.0 g/dL   HCT 29.541.6 62.139.0 - 30.852.0 %   MCV 75.9 (L) 80.0 - 100.0 fL   MCH 23.5 (L) 26.0 - 34.0 pg   MCHC 31.0 30.0 - 36.0 g/dL   RDW 65.720.9 (H) 84.611.5 - 96.215.5 %   Platelets 332 150 - 400 K/uL   nRBC 0.0 0.0 - 0.2 %    Comment: Performed at [facility], [address] [place], [place] [address]  Resp Panel by RT-PCR (Flu A&B, Covid) Anterior Nasal Swab     Status: None   Collection Time: 03/27/22  2:36 PM   Specimen: Anterior Nasal Swab  Result Value Ref Range   SARS Coronavirus 2 by RT PCR NEGATIVE NEGATIVE    Comment: (NOTE) SARS-CoV-2 target nucleic acids are NOT DETECTED.  The SARS-CoV-2 RNA is generally detectable in upper respiratory specimens during the acute phase of infection. The lowest concentration of SARS-CoV-2 viral copies this assay can detect is 138 copies/mL. A negative result does not preclude SARS-Cov-2 infection and should not be used as the sole basis for treatment or other patient management decisions. A negative result may occur with  improper specimen collection/handling, submission of specimen other than nasopharyngeal swab, presence of viral mutation(s) within the areas targeted by this assay, and inadequate number of viral copies(<138 copies/mL). A negative result must be combined with clinical observations, patient history, and epidemiological information. The expected result is Negative.  Fact Sheet for Patients:  BloggerCourse.comhttps://www.fda.gov/media/152166/download  Fact Sheet for Healthcare Providers:   SeriousBroker.ithttps://www.fda.gov/media/152162/download  This test is no t yet approved or  cleared by the [place] and  has been authorized for detection and/or diagnosis of SARS-CoV-2 by FDA under an Emergency Use Authorization (EUA). This EUA will remain  in effect (meaning this test can be used) for the duration of the COVID-19 declaration under Section 564(b)(1) of the Act, 21 U.S.C.section 360bbb-3(b)(1), unless the authorization is terminated  or revoked sooner.       Influenza A by PCR NEGATIVE NEGATIVE   Influenza B by PCR NEGATIVE NEGATIVE    Comment: (NOTE) The Xpert Xpress SARS-CoV-2/FLU/RSV plus assay is intended as an aid in the diagnosis of influenza from Nasopharyngeal swab specimens and should not be used as a sole basis for treatment. Nasal washings and aspirates are unacceptable for Xpert Xpress SARS-CoV-2/FLU/RSV testing.  Fact Sheet for Patients: BloggerCourse.com  Fact Sheet for Healthcare Providers: SeriousBroker.it  This test is not yet approved or cleared by the [place] FDA and has been authorized for detection and/or diagnosis of SARS-CoV-2 by FDA under an Emergency Use Authorization (EUA). This EUA will remain in effect (meaning this test can be used) for the duration of the COVID-19 declaration under Section 564(b)(1) of the Act, 21 U.S.C. section 360bbb-3(b)(1), unless the authorization is terminated or revoked.  Performed at [facility], [address], [place], [place] [address]   Rapid urine drug screen (hospital performed)     Status: Abnormal   Collection Time: 03/27/22  2:55 PM  Result Value Ref Range   Opiates NONE DETECTED NONE DETECTED   Cocaine POSITIVE (A) NONE DETECTED   Benzodiazepines POSITIVE (A) NONE DETECTED   Amphetamines NONE DETECTED NONE DETECTED   Tetrahydrocannabinol POSITIVE (A) NONE DETECTED   Barbiturates NONE DETECTED NONE DETECTED    Comment:  (NOTE) DRUG SCREEN FOR MEDICAL PURPOSES ONLY.  IF CONFIRMATION IS NEEDED FOR ANY PURPOSE, NOTIFY LAB WITHIN 5 DAYS.  LOWEST DETECTABLE LIMITS FOR URINE DRUG SCREEN Drug Class                     Cutoff (ng/mL) Amphetamine and metabolites    1000 Barbiturate and metabolites    200 Benzodiazepine                 200 Tricyclics and metabolites     300 Opiates and metabolites        300 Cocaine and metabolites        300 THC                            50 Performed at [facility], [address], [place], [place] [address]     Current Facility-Administered Medications  Medication Dose Route Frequency Provider Last Rate Last Admin   acetaminophen (TYLENOL) tablet 650 mg  650 mg Oral Q4H PRN Derwood Kaplan, MD       alum & mag hydroxide-simeth (MAALOX/MYLANTA) 200-200-20 MG/5ML suspension 30 mL  30 mL Oral Q6H PRN Barrie Dunker B, PA-C       ziprasidone (GEODON) injection 20 mg  20 mg Intramuscular Q12H PRN Derwood Kaplan, MD   20 mg at 03/27/22 1949   And   LORazepam (ATIVAN) tablet 1 mg  1 mg Oral PRN Derwood Kaplan, MD       losartan (COZAAR) tablet 50 mg  50 mg Oral Daily Rhunette Croft, Ankit, MD   50 mg at 03/27/22 1827   ondansetron (ZOFRAN) tablet 4 mg  4 mg Oral Q8H PRN Barrie Dunker  B, PA-C       zolpidem (AMBIEN) tablet 5 mg  5 mg Oral QHS PRN Derwood Kaplan, MD       Current Outpatient Medications  Medication Sig Dispense Refill   ibuprofen (ADVIL) 800 MG tablet Take 1 tablet (800 mg total) by mouth every 6 (six) hours as needed for moderate pain. 20 tablet 0   tiZANidine (ZANAFLEX) 4 MG capsule 1-2 capsules as needed     gabapentin (NEURONTIN) 300 MG capsule Take 300 mg by mouth 3 (three) times daily.     HYDROcodone-acetaminophen (NORCO/VICODIN) 5-325 MG tablet Take 1-2 tablets by mouth every 4 (four) hours as needed for moderate pain. (Patient not taking: Reported on 03/27/2022) 20 tablet 0   losartan-hydrochlorothiazide (HYZAAR) 100-25 MG tablet  Take 1 tablet by mouth daily.     methocarbamol (ROBAXIN) 500 MG tablet Take 1 tablet (500 mg total) by mouth every 8 (eight) hours as needed for muscle spasms. (Patient not taking: Reported on 03/27/2022) 20 tablet 0   terbinafine (LAMISIL) 250 MG tablet Take 1 tablet (250 mg total) by mouth daily. (Patient not taking: Reported on 04/30/2019) 45 tablet 0    Musculoskeletal: Strength & Muscle Tone: within normal limits Gait & Station: normal Patient leans: N/A   Psychiatric Specialty Exam: Presentation  General Appearance: Casual; Neat  Eye Contact:Good  Speech:Clear and Coherent; Normal Rate  Speech Volume:Increased  Handedness:No data recorded  Mood and Affect  Mood:Angry; Labile  Affect:Congruent; Labile   Thought Process  Thought Processes:Coherent; Linear  Descriptions of Associations:Intact  Orientation:Full (Time, Place and Person)  Thought Content:Rumination  History of Schizophrenia/Schizoaffective disorder:No data recorded Duration of Psychotic Symptoms:No data recorded Hallucinations:Hallucinations: None  Ideas of Reference:None  Suicidal Thoughts:Suicidal Thoughts: No  Homicidal Thoughts:Homicidal Thoughts: No   Sensorium  Memory:Immediate Good; Recent Good  Judgment:Impaired  Insight:Lacking   Executive Functions  Concentration:Fair  Attention Span:Fair  Recall:Good  Fund of Knowledge:Good  Language:Good   Psychomotor Activity  Psychomotor Activity:Psychomotor Activity: Restlessness   Assets  Assets:Financial Resources/Insurance; Housing; Physical Health; Social Support    Sleep  Sleep:Sleep: Fair   Physical Exam: Physical Exam Vitals and nursing note reviewed.  Constitutional:      General: He is not in acute distress.    Appearance: He is not ill-appearing, toxic-appearing or diaphoretic.  Eyes:     General:        Right eye: No discharge.        Left eye: No discharge.  Pulmonary:     Effort: Pulmonary effort  is normal. No respiratory distress.  Musculoskeletal:        General: Normal range of motion.     Cervical back: Normal range of motion.  Neurological:     Mental Status: He is alert and oriented to person, place, and time.  Psychiatric:        Mood and Affect: Affect is labile and angry.        Behavior: Behavior is uncooperative.        Thought Content: Thought content does not include homicidal or suicidal ideation.    Review of Systems  Constitutional:  Negative for chills, diaphoresis, fever, malaise/fatigue and weight loss.  HENT:  Negative for congestion.   Respiratory:  Negative for cough and shortness of breath.   Cardiovascular:  Negative for chest pain and palpitations.  Gastrointestinal:  Negative for diarrhea, nausea and vomiting.  Neurological:  Negative for dizziness and seizures.  Psychiatric/Behavioral:  Positive for substance abuse. Negative for depression, hallucinations,  memory loss and suicidal ideas. The patient is nervous/anxious and has insomnia.   All other systems reviewed and are negative.   Blood pressure 127/73, pulse 83, temperature 97.9 F (36.6 C), temperature source Oral, resp. rate 16, SpO2 91 %. There is no height or weight on file to calculate BMI.  Medical Decision Making: Patient presented to [facility] under IVC after reportedly making suicidal statements to wife and counselor. Patient denies SI. However, he does state "all I said was that I was tired and that I wanted to go be with my mama and daddy." Patient has multiple risk factors, including, marital issues with possible separation, loss of parents, polysubstance abuse, and access to firearms. Considering the serious nature of the IVC content and collateral obtained from his wife, the patient is being recommended for inpatient psychiatric treatment for crisis stabilization.   Patient denies that he has used any substances other than alcohol and nicotine since he was released from substance abuse  treatment facility in February. UDS + benzodiazepines, cocaine, and THC.   EKG 03/27/22: NSR, Qtc 450 ms  Continue agitation protocol previously ordered by EDP -geodon 20 mg IM every 12 hours prn  -Ativan 1 mg oral prn    Problem 1: Adjustment disorder related to being notified that his wife may be moving out  Problem 2: Substance abuse  Problem 3: IVC  Disposition: Recommend psychiatric Inpatient admission when medically cleared.  Jackelyn Poling, NP 03/27/2022 9:22 PM

## 2022-03-27 NOTE — ED Notes (Signed)
Pt belonging bags placed in cabinets by nurses station 16-18.

## 2022-03-27 NOTE — ED Notes (Signed)
Patient resting, eyes closed.  Respirations even and unlabored.  Sitter remains at bedside.

## 2022-03-27 NOTE — ED Notes (Signed)
Patient moved to room 14 without incident.  Sitter remains at bedside.

## 2022-03-27 NOTE — ED Notes (Signed)
Patient resting calmly with sitter at bedside.

## 2022-03-27 NOTE — ED Notes (Addendum)
Pt walked behind nurses station 16-18 and began to open pt belonging cabinets, searching for his things. Pt demanded his belongings and to be released. Pt confronted this Clinical research associate and proceeded to yell profanities. Police and security called to bedside.

## 2022-03-27 NOTE — ED Notes (Signed)
Pt given water 

## 2022-03-27 NOTE — ED Notes (Addendum)
Patient grabbing trashcan acting like he is going to throw it at staff. Patient said we are not putting a fucking vaccine in his arm. Patient said we are not giving him any shot. Patient went behind the nurse station. Patient said he was going to stab staff. [employer] and security at bedside.

## 2022-03-27 NOTE — ED Provider Triage Note (Signed)
Emergency Medicine Provider Triage Evaluation Note  Terrence Conrad , a 51 y.o. male  was evaluated in triage.  Patient brought in by [facility] police for involuntary commitment.  Patient IVC need by his wife who states that the patient was suicidal.  Patient was in marriage counseling this morning when he learned that his wife had applied for a new apartment to move out.  Patient was upset.  Patient's wife reports that he said he wanted to hurt himself and had a gun in the car.  At this time patient denies any suicidal ideation, hallucinations, homicidal ideation.  Patient has no complaints at this time  Review of Systems  Positive: As above Negative: As above  Physical Exam  BP (!) 142/97 (BP Location: Left Arm)   Pulse 94   Temp 98 F (36.7 C) (Oral)   Resp 16   SpO2 97%  Gen:   Awake, no distress   Resp:  Normal effort  MSK:   Moves extremities without difficulty  Other:    Medical Decision Making  Medically screening exam initiated at 1:46 PM.  Appropriate orders placed.  Terrence Conrad was informed that the remainder of the evaluation will be completed by another provider, this initial triage assessment does not replace that evaluation, and the importance of remaining in the ED until their evaluation is complete.     Darrick Grinder, PA-C 03/27/22 1348

## 2022-03-27 NOTE — ED Notes (Signed)
Patient currently resting. Even rise and fall of the chest noted. Will administer patient's medication once patient is alert.

## 2022-03-27 NOTE — ED Notes (Signed)
Pt belongings in cabinet for [place] B, 9-12. 1 Bag.

## 2022-03-27 NOTE — ED Notes (Signed)
Pt eloped out EMS bay doors, after it was made clear they were IVCd. Security was called.

## 2022-03-27 NOTE — ED Triage Notes (Signed)
BIB [employer]. He is IVC'ed. Refusing to cooperate with triage. Refused to give urine sample. Recently went to marriage counseling with his wife, attempted to grab a pistol and made indications towards self harm, pistol location is unknown.

## 2022-03-27 NOTE — ED Notes (Addendum)
Patient yelling and threatening staff, [employer] and security.  Patient in hallway with trashcan in hand, threatening to throw it at staff.  Patient moved to RES B to decrease stimulation.  Patient calmed down slightly but was still showing signs of aggression by balling up fists and threatening staff.  Decision made to give geodon as ordered. Patient agreed to geodon as long as it was given in his arm.

## 2022-03-27 NOTE — ED Provider Notes (Signed)
[facility]-EMERGENCY DEPT Provider Note   CSN: [id number] Arrival date & time: 03/27/22  1309     History  No chief complaint on file.   Terrence Conrad is a 51 y.o. male.  HPI     51 year old malE brought into the emergency room involuntarily committed after he had expressed some suicidal thoughts.  According to the IVC paperwork, patient had gone to a marriage counseling session.  His wife declared that she was approved for different apartment.  Patient upon hearing that stormed out of the counseling session and declared that he was going to end his life.  Thereafter, he tried to get his hands on some firearm.   Patient states that he is history of hypertension.  Denies any mental health history.  He is upset and agitated about police aggressively apprehending him to bring him to the ER.  He denies any substance use disorder.  Pt denies nausea, emesis, fevers, chills, chest pains, shortness of breath, headaches, abdominal pain.   Home Medications Prior to Admission medications   Medication Sig Start Date End Date Taking? Authorizing Provider  ibuprofen (ADVIL) 800 MG tablet Take 1 tablet (800 mg total) by mouth every 6 (six) hours as needed for moderate pain. 04/30/19  Yes Pollina, Canary Brim, MD  HYDROcodone-acetaminophen (NORCO/VICODIN) 5-325 MG tablet Take 1-2 tablets by mouth every 4 (four) hours as needed for moderate pain. Patient not taking: Reported on 03/27/2022 04/30/19   Gilda Crease, MD  methocarbamol (ROBAXIN) 500 MG tablet Take 1 tablet (500 mg total) by mouth every 8 (eight) hours as needed for muscle spasms. Patient not taking: Reported on 03/27/2022 04/30/19   Gilda Crease, MD  terbinafine (LAMISIL) 250 MG tablet Take 1 tablet (250 mg total) by mouth daily. Patient not taking: Reported on 04/30/2019 12/27/14   Lenn Sink, DPM      Allergies    Patient has no known allergies.    Review of Systems   Review of Systems   All other systems reviewed and are negative.   Physical Exam Updated Vital Signs BP (!) 142/97 (BP Location: Left Arm)   Pulse 94   Temp 98 F (36.7 C) (Oral)   Resp 16   SpO2 97%  Physical Exam Vitals and nursing note reviewed.  Constitutional:      Appearance: He is well-developed.  HENT:     Head: Atraumatic.  Cardiovascular:     Rate and Rhythm: Normal rate.  Pulmonary:     Effort: Pulmonary effort is normal.  Musculoskeletal:     Cervical back: Neck supple.  Skin:    General: Skin is warm.  Neurological:     Mental Status: He is alert and oriented to person, place, and time.     ED Results / Procedures / Treatments   Labs (all labs ordered are listed, but only abnormal results are displayed) Labs Reviewed  CBC - Abnormal; Notable for the following components:      Result Value   Hemoglobin 12.9 (*)    MCV 75.9 (*)    MCH 23.5 (*)    RDW 20.9 (*)    All other components within normal limits  RESP PANEL BY RT-PCR (FLU A&B, COVID) ARPGX2  COMPREHENSIVE METABOLIC PANEL  ETHANOL  RAPID URINE DRUG SCREEN, HOSP PERFORMED    EKG None  Radiology No results found.  Procedures Procedures    Medications Ordered in ED Medications  ondansetron (ZOFRAN) tablet 4 mg (has no administration in time  range)  alum & mag hydroxide-simeth (MAALOX/MYLANTA) 200-200-20 MG/5ML suspension 30 mL (has no administration in time range)  ziprasidone (GEODON) injection 20 mg (has no administration in time range)    And  LORazepam (ATIVAN) tablet 1 mg (has no administration in time range)  acetaminophen (TYLENOL) tablet 650 mg (has no administration in time range)  zolpidem (AMBIEN) tablet 5 mg (has no administration in time range)  losartan (COZAAR) tablet 50 mg (has no administration in time range)    ED Course/ Medical Decision Making/ A&P                           Medical Decision Making Amount and/or Complexity of Data Reviewed Labs: ordered.  Risk OTC  drugs. Prescription drug management.   This patient presents to the ED with chief complaint(s) of suicidal thoughts with pertinent past medical history of hypertension, he denies any psychiatric history which further complicates the presenting complaint.   Patient has no medical complaints.  He is gone for counseling since earlier today.  He denies any substance use disorder.  Patient is medically cleared for psychiatric evaluation.  The differential diagnosis includes suicidal ideation due to depression, poor coping mechanism.  The initial plan is to complete the involuntary exam process. Try to get his hands on guns, which is a concerning part.  We will consult psychiatry.   Additional history obtained: Additional history obtained from  police department Records reviewed -patient lives encounters within health care.  Reviewed patient's medication.  He is on losartan.  Independent labs interpretation:  The following labs were independently interpreted: Mildly elevated ethanol level.  Still below the legal limit.  Electrolytes and CBC are reassuring.   Consultation: - Consulted or discussed management/test interpretation w/ external professional: Psychiatry team has been consulted for psychiatric evaluation.  Final Clinical Impression(s) / ED Diagnoses Final diagnoses:  Suicidal thoughts    Rx / DC Orders ED Discharge Orders     None         Derwood Kaplan, MD 03/27/22 1503

## 2022-03-27 NOTE — ED Notes (Signed)
Pt attempted to urinate and was unsuccessful 

## 2022-03-27 NOTE — ED Notes (Signed)
Patient given [place] sandwich and drink per request.  Sitter remains at bedside.

## 2022-03-27 NOTE — ED Notes (Signed)
While walking out of huddle, RN watched patient run out of EMS bay. Called [employer] and security. They are searching for patient.

## 2022-03-27 NOTE — ED Notes (Signed)
While this RN was getting report, patient eloped by EMS doors with [facility] at bedside.  [facility] attempted redirection multiple times.  Security called by off going RN immediately.  Charge RN Sheria Lang speaking with [employer] now.

## 2022-03-27 NOTE — ED Notes (Signed)
Pt returns to ED with [employer] and security. Patient to restroom at this time.

## 2022-03-28 ENCOUNTER — Other Ambulatory Visit: Payer: Self-pay

## 2022-03-28 DIAGNOSIS — Z046 Encounter for general psychiatric examination, requested by authority: Secondary | ICD-10-CM

## 2022-03-28 DIAGNOSIS — F1994 Other psychoactive substance use, unspecified with psychoactive substance-induced mood disorder: Secondary | ICD-10-CM | POA: Diagnosis not present

## 2022-03-28 DIAGNOSIS — F4324 Adjustment disorder with disturbance of conduct: Secondary | ICD-10-CM

## 2022-03-28 MED ORDER — LORAZEPAM 1 MG PO TABS
1.0000 mg | ORAL_TABLET | ORAL | Status: DC | PRN
  Administered 2022-03-29: 1 mg via ORAL
  Filled 2022-03-28: qty 1

## 2022-03-28 MED ORDER — THIAMINE HCL 100 MG PO TABS
100.0000 mg | ORAL_TABLET | Freq: Every day | ORAL | Status: DC
Start: 2022-03-28 — End: 2022-03-29
  Administered 2022-03-29: 100 mg via ORAL
  Filled 2022-03-28: qty 1

## 2022-03-28 MED ORDER — LORAZEPAM 1 MG PO TABS: 1.0000 mg | ORAL_TABLET | Freq: Four times a day (QID) | ORAL | Status: DC | PRN

## 2022-03-28 MED ORDER — ADULT MULTIVITAMIN W/MINERALS CH
1.0000 | ORAL_TABLET | Freq: Every day | ORAL | Status: DC
Start: 2022-03-28 — End: 2022-03-29
  Administered 2022-03-29: 1 via ORAL
  Filled 2022-03-28: qty 1

## 2022-03-28 MED ORDER — LORAZEPAM 2 MG/ML IJ SOLN: 1.0000 mg | INTRAMUSCULAR | Status: DC | PRN

## 2022-03-28 MED ORDER — HYDROXYZINE HCL 25 MG PO TABS
25.0000 mg | ORAL_TABLET | Freq: Four times a day (QID) | ORAL | Status: DC | PRN
  Administered 2022-03-28 – 2022-03-29 (×2): 25 mg via ORAL
  Filled 2022-03-28 (×2): qty 1

## 2022-03-28 MED ORDER — THIAMINE HCL 100 MG/ML IJ SOLN
100.0000 mg | Freq: Every day | INTRAMUSCULAR | Status: DC
Start: 2022-03-28 — End: 2022-03-29

## 2022-03-28 MED ORDER — FOLIC ACID 1 MG PO TABS
1.0000 mg | ORAL_TABLET | Freq: Every day | ORAL | Status: DC
Start: 2022-03-28 — End: 2022-03-29
  Administered 2022-03-29: 1 mg via ORAL
  Filled 2022-03-28: qty 1

## 2022-03-28 MED ORDER — THIAMINE HCL 100 MG PO TABS: 100.0000 mg | ORAL_TABLET | Freq: Every day | ORAL | Status: DC

## 2022-03-28 MED ORDER — LOPERAMIDE HCL 2 MG PO CAPS: 2.0000 mg | ORAL_CAPSULE | ORAL | Status: DC | PRN

## 2022-03-28 MED ORDER — LORAZEPAM 1 MG PO TABS: 1.0000 mg | ORAL_TABLET | Freq: Once | ORAL | Status: DC

## 2022-03-28 NOTE — Progress Notes (Signed)
Per Nira Conn, NP, patient meets criteria for inpatient treatment. There are no available beds at [facility] today. CSW faxed referrals to the following facilities for review:  [facility]  Pending - No Request Sent N/A [address] [place] [address] [phone] --  [facility]  Pending - No Request Sent N/A [address] [place] [address] [phone] --  [facility]  Pending - No Request [facility] [address] [place] [address] [phone] --  [facility]  Pending - No Request Sent N/A [address] [place] [address] [phone] --  [facility]  Pending - No Request Sent N/A [address] [place] [address] [phone] --  [facility]  Pending - No Request Sent N/A [address] [place], [place] [address] [phone] --  [facility]  Pending - No Request Sent N/A [address] [place] [address] [phone] --  [facility]  Pending - No Request Sent N/A [address], [place] [address] [phone] --  [facility]  Pending - No Request Sent N/A [address] [place] [address] [phone] --  [facility]  Pending - No Request Sent N/A [address] [place] [address] [phone] --  [facility]  Pending - No Request Sent N/A [address], [place] [address] [phone] --  [facility]  Pending - No Request Sent N/A [address] [place] [address] [phone] --  [facility]  Pending - No Request Sent N/A [address] [place] [address] [phone] --  [facility]  Pending - No Request Sent N/A [address], [place] [address] [phone] --  [facility]  Pending - No Request Sent N/A [address] [place] [address] [phone] --   TTS will continue to seek bed placement.  Crissie Reese, MSW, Lenice Pressman Phone: [phone] Disposition/TOC

## 2022-03-28 NOTE — Progress Notes (Signed)
TOC CSW completed consult for substance use.  Please reconsult if new social work issues arise, CSW signing off.   Chastidy Ranker Tarpley-Carter, MSW, LCSW-A Pronouns:  She/Her/Hers [facility] Clinical Social Worker Direct Number:  [phone] [email or web address]

## 2022-03-28 NOTE — TOC CAGE-AID Note (Signed)
Transition of Care [facility]) - CAGE-AID Screening   Patient Details  Name: Ramsey Guadamuz MRN: [id number] Date of Birth: 11-Feb-1971  Transition of [facility]) CM/SW Contact:    Zymarion Favorite C Tarpley-Carter, LCSWA Phone Number: 03/28/2022, 11:02 AM   Clinical Narrative: Pt participated in Cage-Aid.  Pt stated he does use ETOH.  Pt was offered resources, due to usage of ETOH. Pt stated he was in rehab for 5 months at [facility] [place] and [facility].  Raegan Winders Tarpley-Carter, MSW, LCSW-A Pronouns:  She/Her/Hers [facility] Clinical Social Worker Direct Number:  [phone] [email or web address]  CAGE-AID Screening:    Have You Ever Felt You Ought to Cut Down on Your Drinking or Drug Use?: Yes (In a rehab for 5 months.) Have People Annoyed You By [employer] Your Drinking Or Drug Use?: Yes Have You Felt Bad Or Guilty About Your Drinking Or Drug Use?: No Have You Ever Had a Drink or Used Drugs First Thing In The Morning to Steady Your Nerves or to Get Rid of a Hangover?: No CAGE-AID Score: 2  Substance Abuse Education Offered: Yes  Substance abuse interventions: Transport planner

## 2022-03-28 NOTE — ED Provider Notes (Signed)
Emergency Medicine Observation Re-evaluation Note  Terrence Conrad is a 51 y.o. male, seen on rounds today.  Pt initially presented to the ED for complaints of No chief complaint on file. Currently, the patient is resting quietly.  Physical Exam  BP (!) 142/110 (BP Location: Left Arm)   Pulse 73   Temp 97.6 F (36.4 C) (Oral)   Resp 18   SpO2 98%  Physical Exam General: No acute distress Cardiac: Well-perfused Lungs: Nonlabored Psych: Calm  ED Course / MDM  EKG:   I have reviewed the labs performed to date as well as medications administered while in observation.  Recent changes in the last 24 hours include psychiatry evaluation.  Plan  Current plan is for inpatient psychiatric admission. Terrence Conrad is under involuntary commitment.      Terrilee Files, MD 03/28/22 [phone]

## 2022-03-28 NOTE — ED Notes (Signed)
Patient gave permission to talk to wife about plan out care.

## 2022-03-28 NOTE — ED Notes (Signed)
Phone requested back after discussed 10 min call limit. Pt stated "I will throw this in your fucking face". Pt also stated "I would rather go to jail than stay here".

## 2022-03-28 NOTE — Progress Notes (Signed)
[facility] ED Progress Note  03/28/2022 10:04 AM Terrence Conrad  MRN:  [id number]   Principal Problem: Adjustment disorder with disturbance of conduct Diagnosis:  Principal Problem:   Adjustment disorder with disturbance of conduct Active Problems:   Involuntary commitment   Substance induced mood disorder [facility])   ED Assessment Time Calculation: Start Time: 0920 Stop Time: 0935 Total Time in Minutes (Assessment Completion): 15  Subjective:  Terrence Conrad is a 51 y.o. male patient who presented to [facility] with law enforcement under IVC. Patient petitioned for IVC by his wife Terrence Conrad [phone]. Per IVC, "Respondent and his wife went to marriage counseling this morning. Wife disclosed that she had been approved for a new apartment. Respondent stormed out. The therapist followed him at which time respondent stated he didn't want to be on earth anymore. While driving home, respondent attempted to grab pistol that was located in the center console. Petitioner was able to get it away from him. He talked about saying goodbye to the kids and going to be with his Mom and Dad.When they arrived home he got the gun away from her, went in the house and possibly retrieved the gun from the safe as it is currently missing, and drove off. Shortly after tow police arrived on a welfare check called in by the therapist. He is currently mobile and danger to self and others."  Patient continues to express discontent that he has been placed under involuntary commitment and is unable to leave. This provider has discussed the my role, the IVC process, the evaluation process, and inpatient admission several times with the patient over the past 2 days. Each time the patient becomes agitated and demands discharge. He states that he is being held against his will and will use his 10 am phone call today to contact his lawyer.   Patient is alert and oriented x 4. His speech is clear and coherent, increased in volume. Eye contact  is good. Mood is angry and affect is congruent with mood. Thought process is coherent. He perseverates on involuntary commitment. He denies auditory and visual hallucinations. No indication that he is responding to internal stimuli. No delusions elicited during this assessment. He continues to deny suicidal ideations. Denies homicidal ideations.   Patient reports that he was sober from alcohol for 5 months. States that he started using alcohol again approximately 2 weeks ago. States that he drinks one 12 once beer a few times a week. Denies daily use. Denies a history of alcohol withdrawal symptoms. Continues to report that he has not used cocaine in 5 months. Reports occasional use of marijuana. Denies use of benzodiazepines and opioids. UDS +benzodiazepines, cocaine, and THC.    Past Psychiatric History: cocaine use, marijuana use, alcohol use. Denies a history of suicide attempts and inpatient psychiatric admissions. Reports residential substance abuse treatment for 5 months at a facility in [place] for the treatment of cocaine use and alcohol use. Returned home in February 2023.   [place] Scale:   Past Medical History:  Past Medical History:  Diagnosis Date   Hypertension    No past surgical history on file. Family History: No family history on file.  Social History:  Social History   Substance and Sexual Activity  Alcohol Use Not Currently   Alcohol/week: 0.0 standard drinks of alcohol     Social History   Substance and Sexual Activity  Drug Use Never    Social History   Socioeconomic History   Marital status: Married  Spouse name: Not on file   Number of children: Not on file   Years of education: Not on file   Highest education level: Not on file  Occupational History   Not on file  Tobacco Use   Smoking status: Light Smoker   Smokeless tobacco: Never  Substance and Sexual Activity   Alcohol use: Not Currently    Alcohol/week: 0.0 standard drinks of alcohol    Drug use: Never   Sexual activity: Not on file  Other Topics Concern   Not on file  Social History Narrative   Not on file   Social Determinants of Conrad   Financial Resource Strain: Not on file  Food Insecurity: Not on file  Transportation Needs: Not on file  Physical Activity: Not on file  Stress: Not on file  Social Connections: Not on file    Sleep: Fair  Appetite:  Good  Current Medications: Current Facility-Administered Medications  Medication Dose Route Frequency Provider Last Rate Last Admin   acetaminophen (TYLENOL) tablet 650 mg  650 mg Oral Q4H PRN Derwood Kaplan, MD       alum & mag hydroxide-simeth (MAALOX/MYLANTA) 200-200-20 MG/5ML suspension 30 mL  30 mL Oral Q6H PRN Barrie Dunker B, PA-C       ziprasidone (GEODON) injection 20 mg  20 mg Intramuscular Q12H PRN Derwood Kaplan, MD   20 mg at 03/27/22 1949   And   LORazepam (ATIVAN) tablet 1 mg  1 mg Oral PRN Derwood Kaplan, MD       losartan (COZAAR) tablet 50 mg  50 mg Oral Daily Rhunette Croft, Ankit, MD   50 mg at 03/27/22 1827   ondansetron (ZOFRAN) tablet 4 mg  4 mg Oral Q8H PRN Barrie Dunker B, PA-C       zolpidem (AMBIEN) tablet 5 mg  5 mg Oral QHS PRN Derwood Kaplan, MD       Current Outpatient Medications  Medication Sig Dispense Refill   ibuprofen (ADVIL) 800 MG tablet Take 1 tablet (800 mg total) by mouth every 6 (six) hours as needed for moderate pain. 20 tablet 0   tiZANidine (ZANAFLEX) 4 MG capsule 1-2 capsules as needed     gabapentin (NEURONTIN) 300 MG capsule Take 300 mg by mouth 3 (three) times daily.     HYDROcodone-acetaminophen (NORCO/VICODIN) 5-325 MG tablet Take 1-2 tablets by mouth every 4 (four) hours as needed for moderate pain. (Patient not taking: Reported on 03/27/2022) 20 tablet 0   losartan-hydrochlorothiazide (HYZAAR) 100-25 MG tablet Take 1 tablet by mouth daily.     methocarbamol (ROBAXIN) 500 MG tablet Take 1 tablet (500 mg total) by mouth every 8 (eight) hours as needed for  muscle spasms. (Patient not taking: Reported on 03/27/2022) 20 tablet 0   terbinafine (LAMISIL) 250 MG tablet Take 1 tablet (250 mg total) by mouth daily. (Patient not taking: Reported on 04/30/2019) 45 tablet 0    Lab Results:  Results for orders placed or performed during the hospital encounter of 03/27/22 (from the past 48 hour(s))  Comprehensive metabolic panel     Status: Abnormal   Collection Time: 03/27/22  1:59 PM  Result Value Ref Range   Sodium 140 135 - 145 mmol/L   Potassium 3.7 3.5 - 5.1 mmol/L   Chloride 107 98 - 111 mmol/L   CO2 24 22 - 32 mmol/L   Glucose, Bld 115 (H) 70 - 99 mg/dL    Comment: Glucose reference range applies only to samples taken after fasting for at  least 8 hours.   BUN 18 6 - 20 mg/dL   Creatinine, Ser 5.781.08 0.61 - 1.24 mg/dL   Calcium 9.3 8.9 - 46.910.3 mg/dL   Total Protein 7.7 6.5 - 8.1 g/dL   Albumin 4.0 3.5 - 5.0 g/dL   AST 21 15 - 41 U/L   ALT 24 0 - 44 U/L   Alkaline Phosphatase 79 38 - 126 U/L   Total Bilirubin 0.3 0.3 - 1.2 mg/dL   GFR, Estimated >62>60 >95>60 mL/min    Comment: (NOTE) Calculated using the CKD-EPI Creatinine Equation (2021)    Anion gap 9 5 - 15    Comment: Performed at [facility], [address] [place], [place] [address]  Ethanol     Status: Abnormal   Collection Time: 03/27/22  1:59 PM  Result Value Ref Range   Alcohol, Ethyl (B) 16 (H) <10 mg/dL    Comment: (NOTE) Lowest detectable limit for serum alcohol is 10 mg/dL.  For medical purposes only. Performed at [facility], [address] [place], [place] [address]   cbc     Status: Abnormal   Collection Time: 03/27/22  1:59 PM  Result Value Ref Range   WBC 9.2 4.0 - 10.5 K/uL   RBC 5.48 4.22 - 5.81 MIL/uL   Hemoglobin 12.9 (L) 13.0 - 17.0 g/dL   HCT 02.741.6 25.339.0 - 66.452.0 %   MCV 75.9 (L) 80.0 - 100.0 fL   MCH 23.5 (L) 26.0 - 34.0 pg   MCHC 31.0 30.0 - 36.0 g/dL   RDW 40.320.9 (H) 47.411.5 - 25.915.5 %   Platelets 332 150 - 400 K/uL   nRBC  0.0 0.0 - 0.2 %    Comment: Performed at [facility], [address] [place], [place] [address]  Resp Panel by RT-PCR (Flu A&B, Covid) Anterior Nasal Swab     Status: None   Collection Time: 03/27/22  2:36 PM   Specimen: Anterior Nasal Swab  Result Value Ref Range   SARS Coronavirus 2 by RT PCR NEGATIVE NEGATIVE    Comment: (NOTE) SARS-CoV-2 target nucleic acids are NOT DETECTED.  The SARS-CoV-2 RNA is generally detectable in upper respiratory specimens during the acute phase of infection. The lowest concentration of SARS-CoV-2 viral copies this assay can detect is 138 copies/mL. A negative result does not preclude SARS-Cov-2 infection and should not be used as the sole basis for treatment or other patient management decisions. A negative result may occur with  improper specimen collection/handling, submission of specimen other than nasopharyngeal swab, presence of viral mutation(s) within the areas targeted by this assay, and inadequate number of viral copies(<138 copies/mL). A negative result must be combined with clinical observations, patient history, and epidemiological information. The expected result is Negative.  Fact Sheet for Patients:  BloggerCourse.comhttps://www.fda.gov/media/152166/download  Fact Sheet for Healthcare Providers:  SeriousBroker.ithttps://www.fda.gov/media/152162/download  This test is no t yet approved or cleared by the [place] FDA and  has been authorized for detection and/or diagnosis of SARS-CoV-2 by FDA under an Emergency Use Authorization (EUA). This EUA will remain  in effect (meaning this test can be used) for the duration of the COVID-19 declaration under Section 564(b)(1) of the Act, 21 U.S.C.section 360bbb-3(b)(1), unless the authorization is terminated  or revoked sooner.       Influenza A by PCR NEGATIVE NEGATIVE   Influenza B by PCR NEGATIVE NEGATIVE    Comment: (NOTE) The Xpert Xpress SARS-CoV-2/FLU/RSV plus assay is intended as an  aid in the diagnosis of influenza from Nasopharyngeal swab specimens and should not be used as a sole basis for treatment. Nasal washings and aspirates are unacceptable for Xpert Xpress SARS-CoV-2/FLU/RSV testing.  Fact Sheet for Patients: BloggerCourse.com  Fact Sheet for Healthcare Providers: SeriousBroker.it  This test is not yet approved or cleared by the [place] FDA and has been authorized for detection and/or diagnosis of SARS-CoV-2 by FDA under an Emergency Use Authorization (EUA). This EUA will remain in effect (meaning this test can be used) for the duration of the COVID-19 declaration under Section 564(b)(1) of the Act, 21 U.S.C. section 360bbb-3(b)(1), unless the authorization is terminated or revoked.  Performed at [facility], [address] [place], [place] [address]   Rapid urine drug screen (hospital performed)     Status: Abnormal   Collection Time: 03/27/22  2:55 PM  Result Value Ref Range   Opiates NONE DETECTED NONE DETECTED   Cocaine POSITIVE (A) NONE DETECTED   Benzodiazepines POSITIVE (A) NONE DETECTED   Amphetamines NONE DETECTED NONE DETECTED   Tetrahydrocannabinol POSITIVE (A) NONE DETECTED   Barbiturates NONE DETECTED NONE DETECTED    Comment: (NOTE) DRUG SCREEN FOR MEDICAL PURPOSES ONLY.  IF CONFIRMATION IS NEEDED FOR ANY PURPOSE, NOTIFY LAB WITHIN 5 DAYS.  LOWEST DETECTABLE LIMITS FOR URINE DRUG SCREEN Drug Class                     Cutoff (ng/mL) Amphetamine and metabolites    1000 Barbiturate and metabolites    200 Benzodiazepine                 200 Tricyclics and metabolites     300 Opiates and metabolites        300 Cocaine and metabolites        300 THC                            50 Performed at [facility], [address] [place], [place] [address]     Blood Alcohol level:  Lab Results  Component Value Date   ETH 16 (H) 03/27/2022    ETH <10 04/30/2019    Physical Findings:  CIWA:    COWS:     Musculoskeletal: Strength & Muscle Tone: within normal limits Gait & Station: normal Patient leans: N/A  Psychiatric Specialty Exam:  Presentation  General Appearance: Casual; Neat  Eye Contact:Good  Speech:Clear and Coherent; Normal Rate  Speech Volume:Increased  Handedness:No data recorded  Mood and Affect  Mood:Angry  Affect:Labile; Restricted   Thought Process  Thought Processes:Coherent; Linear  Descriptions of Associations:Intact  Orientation:Full (Time, Place and Person)  Thought Content:Rumination  History of Schizophrenia/Schizoaffective disorder:No data recorded Duration of Psychotic Symptoms:No data recorded Hallucinations:Hallucinations: None  Ideas of Reference:None  Suicidal Thoughts:Suicidal Thoughts: No  Homicidal Thoughts:Homicidal Thoughts: No   Sensorium  Memory:Immediate Good; Recent Good; Remote Good  Judgment:Impaired  Insight:Lacking   Executive Functions  Concentration:Fair  Attention Span:Fair  Recall:Good  Fund of Knowledge:Good  Language:Good   Psychomotor Activity  Psychomotor Activity:Psychomotor Activity: Restlessness   Assets  Assets:Financial Resources/Insurance; Housing; Physical Conrad; Social Support   Sleep  Sleep:Sleep: Fair    Physical Exam: Physical Exam ROS Blood pressure (!) 142/110, pulse 73, temperature 97.6 F (36.4 C), temperature source Oral, resp. rate 18, SpO2 98 %. There is no height or weight on file to calculate BMI.   Medical Decision Making: Patient  presented to [facility] under IVC after reportedly making suicidal statements to wife and counselor. Patient denies SI. However, he does state "all I said was that I was tired and that I wanted to go be with my mama and daddy." Patient has multiple risk factors, including, marital issues with possible separation, loss of parents, polysubstance abuse, and access to  firearms. Considering the serious nature of the IVC content and collateral obtained from his wife, the patient is being recommended for inpatient psychiatric treatment for crisis stabilization.    Patient denies that he has used any substances other than alcohol and nicotine since he was released from substance abuse treatment facility in February. UDS + benzodiazepines, cocaine, and THC.   Continue Ativan CIWA protocol for potential alcohol or benzodiazepine withdrawal symptoms.    EKG 03/27/22: NSR, Qtc 450 ms   Continue agitation protocol previously ordered by EDP -geodon 20 mg IM every 12 hours prn  -Ativan 1 mg oral prn   Problem 1: Adjustment disorder related to being notified that his wife may be moving out   Problem 2: Substance abuse   Problem 3: IVC  Patient has been accepted for transfer to [facility] for inpatient psychiatric treatment and can transfer on 03/29/2022.  Jackelyn Poling, NP 03/28/2022, 10:04 AM

## 2022-03-28 NOTE — ED Notes (Signed)
Pt given portable phone, allowed 10 min phone call with wife. Expectations of appropriate behavior were discussed.

## 2022-03-28 NOTE — Progress Notes (Signed)
Per Demarcus Vereen, [facility] admissions, pt has been accepted to [facility], main campus. Accepting provider is Dr. Jola Babinski. Patient can arrive 03/29/2022 after 9:00am. Number for report is [phone]) [phone]. Acceptance is pending negative COVID result being faxed to fax#: [phone], before calling report.    Crissie Reese, MSW, LCSW-A Phone: [phone] Disposition/TOC

## 2022-03-28 NOTE — Progress Notes (Signed)
CSW was contacted by Olivia Mackie [facility], in reference to reviewing for possible placement.It was requested to speak with the patient's nurse for further review. CSW provided contact information.    Crissie Reese, MSW, Lenice Pressman Phone: [phone] Disposition/TOC

## 2022-03-28 NOTE — ED Notes (Signed)
Patient took a shower and is now meditating at bedside.

## 2022-03-28 NOTE — Progress Notes (Addendum)
CSW was advised by nursing staff via secure chat, that patient has been declined to [facility] due to currently having no appropriate beds available.    Crissie Reese, MSW, Lenice Pressman Phone: [phone] Disposition/TOC

## 2022-03-28 NOTE — ED Notes (Signed)
Patient has been very agitated about what is going on and being transferred. Dr Gasper Sells at bedside talking to patient about plan of care.

## 2022-03-28 NOTE — Progress Notes (Signed)
CSW was contacted by Hart Carwin,  [facility] hospital admissions, in reference to reviewing patient's referral for placement.  Crissie Reese, MSW, Lenice Pressman Phone: [phone] Disposition/TOC

## 2022-03-28 NOTE — ED Notes (Signed)
Patient was getting aggravated and asked to step outside for "fresh air." Nurse was asked for permission, she approved. Officers currently walking with him.

## 2022-03-29 DIAGNOSIS — M5430 Sciatica, unspecified side: Secondary | ICD-10-CM | POA: Diagnosis not present

## 2022-03-29 DIAGNOSIS — Z79899 Other long term (current) drug therapy: Secondary | ICD-10-CM | POA: Diagnosis not present

## 2022-03-29 DIAGNOSIS — F4324 Adjustment disorder with disturbance of conduct: Secondary | ICD-10-CM | POA: Diagnosis not present

## 2022-03-29 DIAGNOSIS — F1994 Other psychoactive substance use, unspecified with psychoactive substance-induced mood disorder: Secondary | ICD-10-CM | POA: Diagnosis not present

## 2022-03-29 DIAGNOSIS — F1410 Cocaine abuse, uncomplicated: Secondary | ICD-10-CM | POA: Diagnosis not present

## 2022-03-29 DIAGNOSIS — F4321 Adjustment disorder with depressed mood: Secondary | ICD-10-CM | POA: Diagnosis not present

## 2022-03-29 DIAGNOSIS — R45851 Suicidal ideations: Secondary | ICD-10-CM | POA: Diagnosis not present

## 2022-03-29 DIAGNOSIS — I10 Essential (primary) hypertension: Secondary | ICD-10-CM | POA: Diagnosis not present

## 2022-03-29 DIAGNOSIS — Z20822 Contact with and (suspected) exposure to covid-19: Secondary | ICD-10-CM | POA: Diagnosis not present

## 2022-03-29 DIAGNOSIS — F1520 Other stimulant dependence, uncomplicated: Secondary | ICD-10-CM | POA: Diagnosis not present

## 2022-03-29 DIAGNOSIS — F4320 Adjustment disorder, unspecified: Secondary | ICD-10-CM | POA: Diagnosis not present

## 2022-03-29 NOTE — ED Notes (Signed)
[employer]iff[employer] dept called for transport.  

## 2022-03-29 NOTE — ED Notes (Signed)
Report given to Carrie,  RN at [facility]. 

## 2022-03-29 NOTE — ED Notes (Signed)
Attempted to call report to [facility] with no answer x1.

## 2022-04-05 DIAGNOSIS — S32010A Wedge compression fracture of first lumbar vertebra, initial encounter for closed fracture: Secondary | ICD-10-CM | POA: Diagnosis not present

## 2022-04-05 DIAGNOSIS — Y92410 Unspecified street and highway as the place of occurrence of the external cause: Secondary | ICD-10-CM | POA: Diagnosis not present

## 2022-04-05 DIAGNOSIS — Y999 Unspecified external cause status: Secondary | ICD-10-CM | POA: Diagnosis not present

## 2022-04-05 DIAGNOSIS — K5790 Diverticulosis of intestine, part unspecified, without perforation or abscess without bleeding: Secondary | ICD-10-CM | POA: Diagnosis not present

## 2022-04-05 DIAGNOSIS — I517 Cardiomegaly: Secondary | ICD-10-CM | POA: Diagnosis not present

## 2022-04-05 DIAGNOSIS — S0990XA Unspecified injury of head, initial encounter: Secondary | ICD-10-CM | POA: Diagnosis not present

## 2022-04-05 DIAGNOSIS — I10 Essential (primary) hypertension: Secondary | ICD-10-CM | POA: Diagnosis not present

## 2022-04-05 DIAGNOSIS — M545 Low back pain, unspecified: Secondary | ICD-10-CM | POA: Diagnosis not present

## 2022-04-08 DIAGNOSIS — M5432 Sciatica, left side: Secondary | ICD-10-CM | POA: Diagnosis not present

## 2022-04-08 DIAGNOSIS — I517 Cardiomegaly: Secondary | ICD-10-CM | POA: Diagnosis not present

## 2022-04-08 DIAGNOSIS — M549 Dorsalgia, unspecified: Secondary | ICD-10-CM | POA: Diagnosis not present

## 2022-04-08 DIAGNOSIS — L2082 Flexural eczema: Secondary | ICD-10-CM | POA: Diagnosis not present

## 2022-04-21 ENCOUNTER — Ambulatory Visit: Payer: BC Managed Care – PPO | Attending: Cardiology | Admitting: Cardiology

## 2022-04-21 ENCOUNTER — Encounter: Payer: Self-pay | Admitting: Cardiology

## 2022-04-21 VITALS — BP 122/70 | HR 87 | Ht 74.0 in | Wt 274.2 lb

## 2022-04-21 DIAGNOSIS — I10 Essential (primary) hypertension: Secondary | ICD-10-CM

## 2022-04-21 DIAGNOSIS — E669 Obesity, unspecified: Secondary | ICD-10-CM | POA: Diagnosis not present

## 2022-04-21 DIAGNOSIS — E7849 Other hyperlipidemia: Secondary | ICD-10-CM | POA: Insufficient documentation

## 2022-04-21 DIAGNOSIS — I517 Cardiomegaly: Secondary | ICD-10-CM

## 2022-04-21 DIAGNOSIS — I2510 Atherosclerotic heart disease of native coronary artery without angina pectoris: Secondary | ICD-10-CM

## 2022-04-21 DIAGNOSIS — E8881 Metabolic syndrome: Secondary | ICD-10-CM

## 2022-04-21 DIAGNOSIS — E1169 Type 2 diabetes mellitus with other specified complication: Secondary | ICD-10-CM

## 2022-04-21 DIAGNOSIS — E785 Hyperlipidemia, unspecified: Secondary | ICD-10-CM

## 2022-04-21 NOTE — Patient Instructions (Signed)
Medication Instructions:  No changes   *If you need a refill on your cardiac medications before your next appointment, please call your pharmacy*   Lab Work:  Not needed     Testing/Procedures:  Your physician has requested that you have an echocardiogram. Echocardiography is a painless test that uses sound waves to create images of your heart. It provides your doctor with information about the size and shape of your heart and how well your heart's chambers and valves are working. This procedure takes approximately one hour. There are no restrictions for this procedure.  And   CT coronary calcium score.   Test locations:  [facility]   This is $99 out of pocket.   Coronary CalciumScan A coronary calcium scan is an imaging test used to look for deposits of calcium and other fatty materials (plaques) in the inner lining of the blood vessels of the heart (coronary arteries). These deposits of calcium and plaques can partly clog and narrow the coronary arteries without producing any symptoms or warning signs. This puts a person at risk for a heart attack. This test can detect these deposits before symptoms develop. Tell a health care provider about: Any allergies you have. All medicines you are taking, including vitamins, herbs, eye drops, creams, and over-the-counter medicines. Any problems you or family members have had with anesthetic medicines. Any blood disorders you have. Any surgeries you have had. Any medical conditions you have. Whether you are pregnant or may be pregnant. What are the risks? Generally, this is a safe procedure. However, problems may occur, including: Harm to a pregnant woman and her unborn baby. This test involves the use of radiation. Radiation exposure can be dangerous to a pregnant woman and her unborn baby. If you are pregnant, you generally should not have this procedure done. Slight increase in the risk of cancer.  This is because of the radiation involved in the test. What happens before the procedure? No preparation is needed for this procedure. What happens during the procedure? You will undress and remove any jewelry around your neck or chest. You will put on a hospital gown. Sticky electrodes will be placed on your chest. The electrodes will be connected to an electrocardiogram (ECG) machine to record a tracing of the electrical activity of your heart. A CT scanner will take pictures of your heart. During this time, you will be asked to lie still and hold your breath for 2-3 seconds while a picture of your heart is being taken. The procedure may vary among health care providers and hospitals. What happens after the procedure? You can get dressed. You can return to your normal activities. It is up to you to get the results of your test. Ask your health care provider, or the department that is doing the test, when your results will be ready. Summary A coronary calcium scan is an imaging test used to look for deposits of calcium and other fatty materials (plaques) in the inner lining of the blood vessels of the heart (coronary arteries). Generally, this is a safe procedure. Tell your health care provider if you are pregnant or may be pregnant. No preparation is needed for this procedure. A CT scanner will take pictures of your heart. You can return to your normal activities after the scan is done. This information is not intended to replace advice given to you by your health care provider. Make sure you discuss any questions you have with your health care provider.  Document Released: 01/29/2008 Document Revised: 06/21/2016 Document Reviewed: 06/21/2016 Elsevier Interactive Patient Education  2017 [employer].   Follow-Up: At [facility], you and your health needs are our priority.  As part of our continuing mission to provide you with exceptional heart care, we have created designated Provider  Care Teams.  These Care Teams include your primary Cardiologist (physician) and Advanced Practice Providers (APPs -  Physician Assistants and Nurse Practitioners) who all work together to provide you with the care you need, when you need it.  We recommend signing up for the patient portal called "MyChart".  Sign up information is provided on this After Visit Summary.  MyChart is used to connect with patients for Virtual Visits (Telemedicine).  Patients are able to view lab/test results, encounter notes, upcoming appointments, etc.  Non-urgent messages can be sent to your provider as well.   To learn more about what you can do with MyChart, go to [email or web address].    Your next appointment:   2 month(s)  The format for your next appointment:   In Person  Provider:   Bryan Lemma, MD
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
## 2022-04-23 DIAGNOSIS — M5416 Radiculopathy, lumbar region: Secondary | ICD-10-CM | POA: Diagnosis not present

## 2022-04-25 ENCOUNTER — Encounter: Payer: Self-pay | Admitting: Cardiology

## 2022-04-25 DIAGNOSIS — E8881 Metabolic syndrome: Secondary | ICD-10-CM | POA: Insufficient documentation

## 2022-04-25 NOTE — Assessment & Plan Note (Signed)
Well-controlled blood pressure combination ARB-HCTZ.  Appropriate medications being diabetic. Adequate reduction.  Checking 2D Echocardiogram to evaluate for evidence of Hypertensive Heart Disease

## 2022-04-25 NOTE — Assessment & Plan Note (Signed)
Working on weight loss, needs to continue. ->  Goal should be to drop 10% of body weight in 1 year.  This would be 27 pounds.  Would likely benefit from nutrition counseling.-Healthy Weight and Wellness

## 2022-04-25 NOTE — Assessment & Plan Note (Signed)
Cardiomegaly seen on CT scan.  I suspect that this is probably just CT scan related magnification.  He does have hypertension which is prolonged, but he is also a large man who may very well have a somewhat enlarged heart which would be normal for his age. No abnormal findings on exam.  He does have LVH on EKG.   Plan: Check 2D echo.

## 2022-04-25 NOTE — Assessment & Plan Note (Signed)
Coronary calcification in the patient with hypertension hyperlipidemia and A1c of 7.1 consistent with diabetes.  He is also obese.  This meets criteria for metabolic syndrome.  Not currently on any medications for diabetes or hyperlipidemia.  Would like to Risk Stratify with quantification of coronary artery calcium with a Coronary Calcium Score.  Based on those results, we can then determine appropriate treatment of Hyperlipidemia.  Would also encourage more aggressive management of Diabetes Mellitus, Type II with A1c of 7.1.

## 2022-04-25 NOTE — Assessment & Plan Note (Signed)
Labs from February 2022 showed an LDL of 129.  He now has evidence of Coronary Calcification.  Plan: Coronary Calcium Score for Risk Stratification -> I suspect that we will likely need to start him on antilipid agents.  Would probably start with rosuvastatin at 20 mg

## 2022-04-25 NOTE — Assessment & Plan Note (Signed)
Combination of hypertension, diabetes and obesity meets criteria for Metabolic Syndrome compounded by hyperlipidemia with LDL of 129.  Risk Ratification with Coronary Calcium Score-low threshold to initiate statin, and consider GLP-1 agonist for diabetes management which would help with weight loss.

## 2022-04-26 ENCOUNTER — Other Ambulatory Visit: Payer: Self-pay | Admitting: Orthopedic Surgery

## 2022-04-26 DIAGNOSIS — G8929 Other chronic pain: Secondary | ICD-10-CM

## 2022-04-26 DIAGNOSIS — M545 Low back pain, unspecified: Secondary | ICD-10-CM

## 2022-05-04 ENCOUNTER — Ambulatory Visit
Admission: RE | Admit: 2022-05-04 | Discharge: 2022-05-04 | Disposition: A | Discharge status: Home / Self Care | Payer: BC Managed Care – PPO | Source: Ambulatory Visit | Attending: Orthopedic Surgery | Admitting: Orthopedic Surgery

## 2022-05-04 DIAGNOSIS — M545 Low back pain, unspecified: Secondary | ICD-10-CM

## 2022-05-04 DIAGNOSIS — M47816 Spondylosis without myelopathy or radiculopathy, lumbar region: Secondary | ICD-10-CM | POA: Diagnosis not present

## 2022-05-04 MED ORDER — IOPAMIDOL (ISOVUE-M 200) INJECTION 41%
1.0000 mL | Freq: Once | INTRAMUSCULAR | Status: AC
  Administered 2022-05-04: 1 mL via EPIDURAL

## 2022-05-04 MED ORDER — METHYLPREDNISOLONE ACETATE 40 MG/ML INJ SUSP (RADIOLOG
80.0000 mg | Freq: Once | INTRAMUSCULAR | Status: AC
  Administered 2022-05-04: 80 mg via EPIDURAL

## 2022-05-04 NOTE — Discharge Instructions (Signed)

## 2022-05-31 ENCOUNTER — Encounter (HOSPITAL_BASED_OUTPATIENT_CLINIC_OR_DEPARTMENT_OTHER): Payer: Self-pay

## 2022-05-31 ENCOUNTER — Ambulatory Visit (HOSPITAL_BASED_OUTPATIENT_CLINIC_OR_DEPARTMENT_OTHER)
Admission: RE | Admit: 2022-05-31 | Discharge: 2022-05-31 | Disposition: A | Discharge status: Home / Self Care | Payer: BC Managed Care – PPO | Source: Ambulatory Visit | Attending: Cardiology | Admitting: Cardiology

## 2022-05-31 DIAGNOSIS — I517 Cardiomegaly: Secondary | ICD-10-CM | POA: Insufficient documentation

## 2022-05-31 DIAGNOSIS — I2510 Atherosclerotic heart disease of native coronary artery without angina pectoris: Secondary | ICD-10-CM | POA: Insufficient documentation

## 2022-06-02 ENCOUNTER — Other Ambulatory Visit (HOSPITAL_BASED_OUTPATIENT_CLINIC_OR_DEPARTMENT_OTHER): Payer: BC Managed Care – PPO

## 2022-06-04 DIAGNOSIS — M5416 Radiculopathy, lumbar region: Secondary | ICD-10-CM | POA: Diagnosis not present

## 2022-06-18 ENCOUNTER — Ambulatory Visit (INDEPENDENT_AMBULATORY_CARE_PROVIDER_SITE_OTHER): Payer: BC Managed Care – PPO

## 2022-06-18 DIAGNOSIS — I2510 Atherosclerotic heart disease of native coronary artery without angina pectoris: Secondary | ICD-10-CM

## 2022-06-18 DIAGNOSIS — I517 Cardiomegaly: Secondary | ICD-10-CM

## 2022-06-18 LAB — ECHOCARDIOGRAM COMPLETE
Area-P 1/2: 3.37 cm2
S' Lateral: 3.79 cm
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
## 2022-07-07 ENCOUNTER — Ambulatory Visit: Payer: BC Managed Care – PPO | Attending: Cardiology | Admitting: Cardiology

## 2022-07-07 DIAGNOSIS — I2510 Atherosclerotic heart disease of native coronary artery without angina pectoris: Secondary | ICD-10-CM

## 2022-07-07 DIAGNOSIS — I10 Essential (primary) hypertension: Secondary | ICD-10-CM

## 2022-07-07 DIAGNOSIS — E8881 Metabolic syndrome: Secondary | ICD-10-CM

## 2022-07-07 DIAGNOSIS — E1169 Type 2 diabetes mellitus with other specified complication: Secondary | ICD-10-CM

## 2022-07-07 DIAGNOSIS — I517 Cardiomegaly: Secondary | ICD-10-CM

## 2022-07-22 ENCOUNTER — Encounter: Payer: Self-pay | Admitting: Cardiology

## 2022-11-16 DIAGNOSIS — E785 Hyperlipidemia, unspecified: Secondary | ICD-10-CM | POA: Diagnosis not present

## 2022-11-16 DIAGNOSIS — I10 Essential (primary) hypertension: Secondary | ICD-10-CM | POA: Diagnosis not present

## 2022-11-16 DIAGNOSIS — E1165 Type 2 diabetes mellitus with hyperglycemia: Secondary | ICD-10-CM | POA: Diagnosis not present

## 2022-11-16 DIAGNOSIS — K219 Gastro-esophageal reflux disease without esophagitis: Secondary | ICD-10-CM | POA: Diagnosis not present

## 2022-11-16 DIAGNOSIS — Z125 Encounter for screening for malignant neoplasm of prostate: Secondary | ICD-10-CM | POA: Diagnosis not present

## 2023-03-31 IMAGING — MR MR LUMBAR SPINE W/O CM
4 of 5 series · 19 of 48 positions shown · non-contrast
Comparison: None.

CLINICAL DATA: Low back pain radiating into the left leg for 2
months

EXAM:
MRI LUMBAR SPINE WITHOUT CONTRAST
TECHNIQUE: Multiplanar, multisequence MR imaging of the lumbar spine was
performed. No intravenous contrast was administered.

[Series 5: T2 · sagittal · 4.0mm · 0.73mm/px · 7 of 19 slices shown (1 of 2)]
[im 1/19]
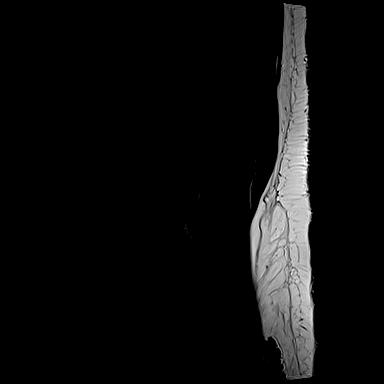
[im 4/19]
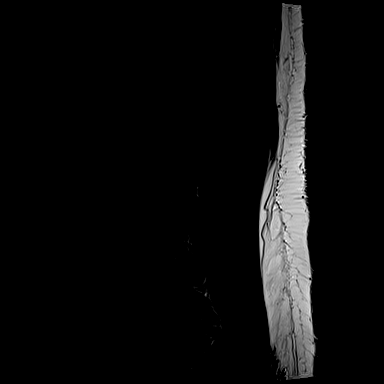
[im 7/19]
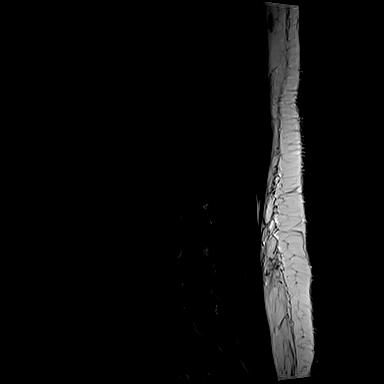
[im 10/19]
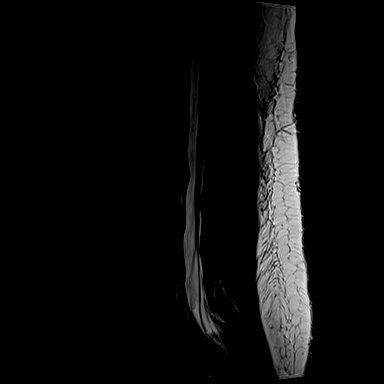
[im 13/19]
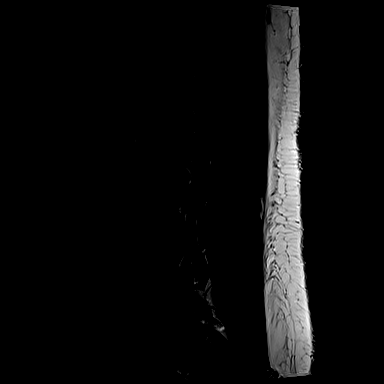
[im 16/19]
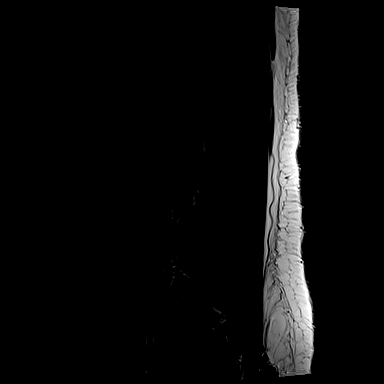
[im 19/19]
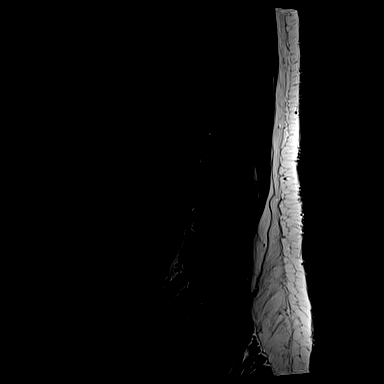

[Series 6: T1 · sagittal · 4.0mm · 0.73mm/px · 3 of 19 slices shown (1 of 2)]
[im 4/19]
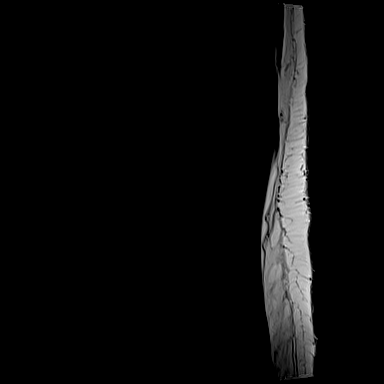
[im 10/19]
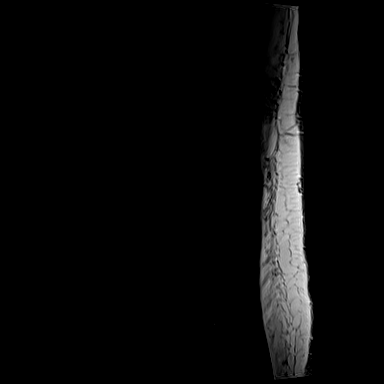
[im 16/19]
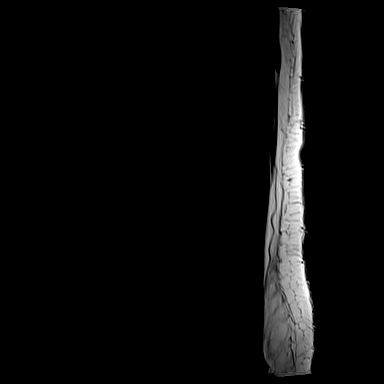

[Series 10: T1 · axial · 4.0mm · 0.28mm/px · z∈[-53,+100]mm · 3 of 42 slices shown (2 of 2)]
[im 7/42]
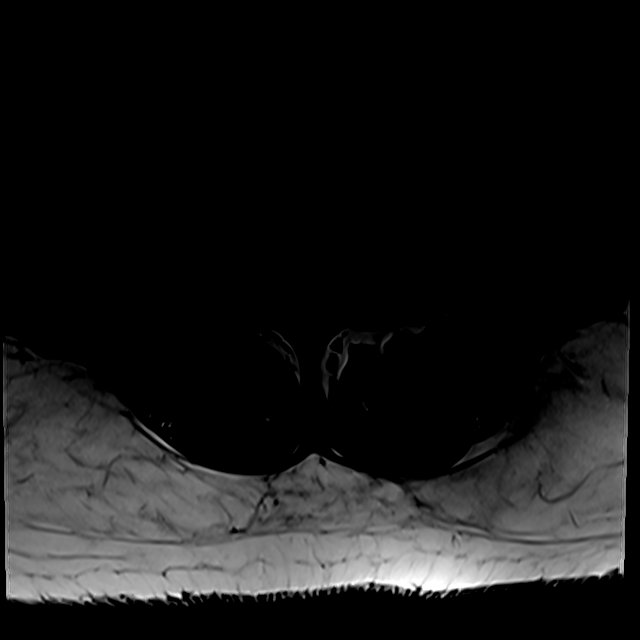
[im 23/42]
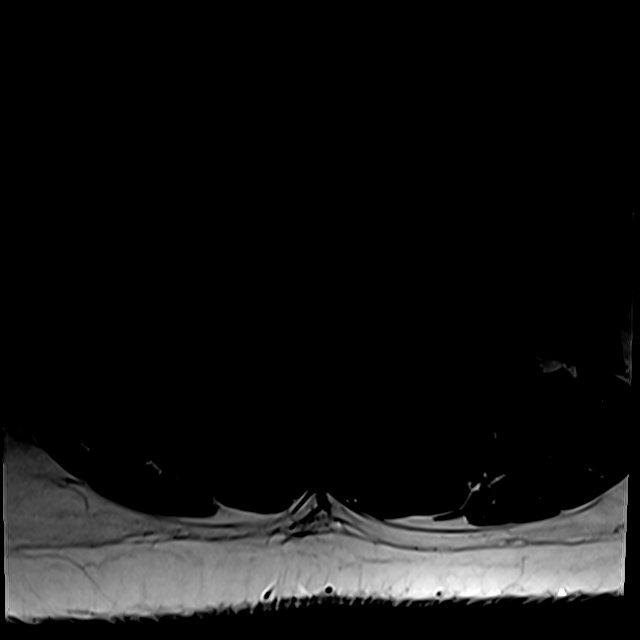
[im 35/42]
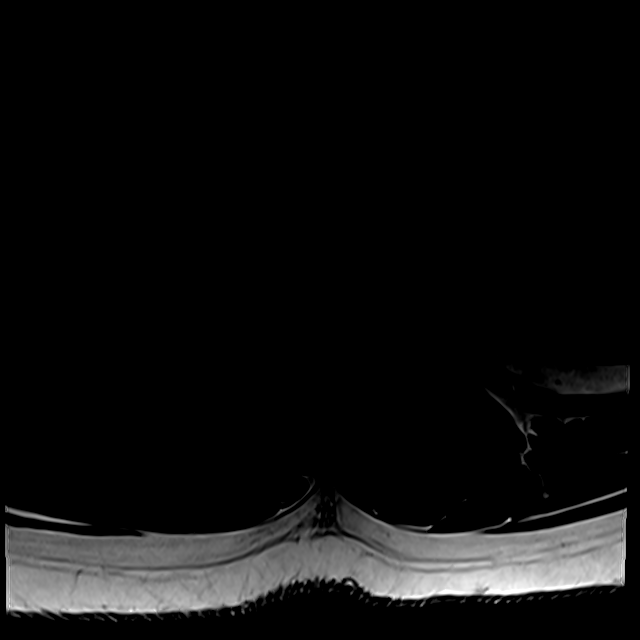

[Series 13: T2 · axial · 4.0mm · 0.28mm/px · z∈[-83,+100]mm · 6 of 42 slices shown (2 of 2)]
[im 1/42]
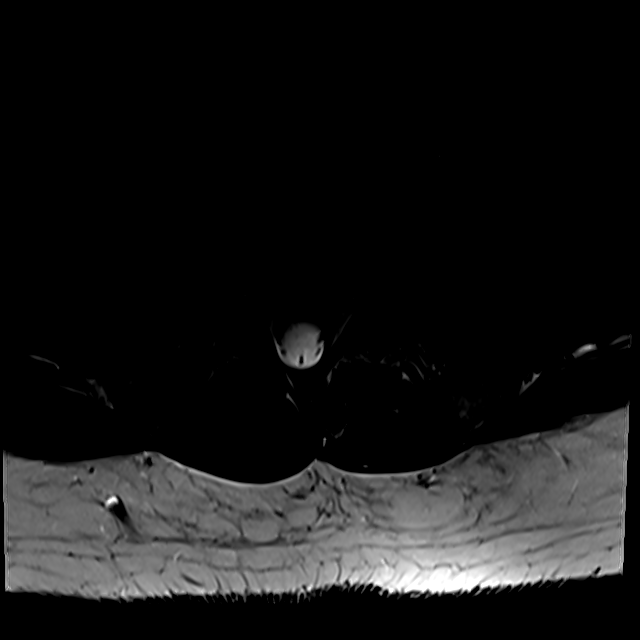
[im 7/42]
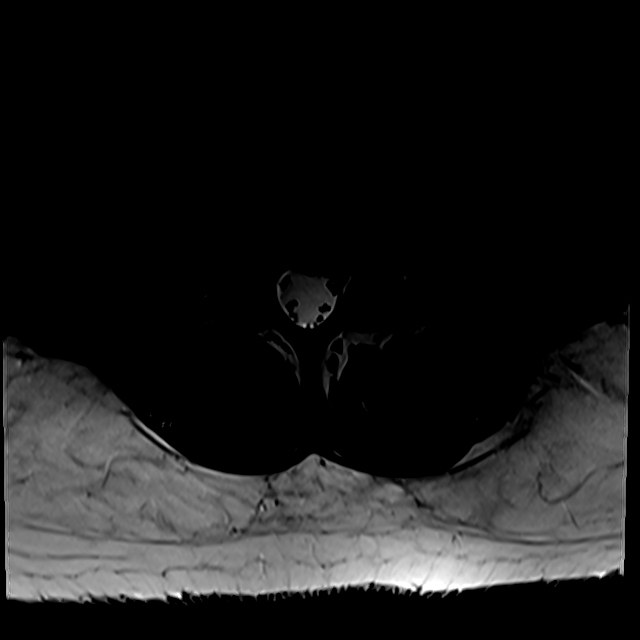
[im 13/42]
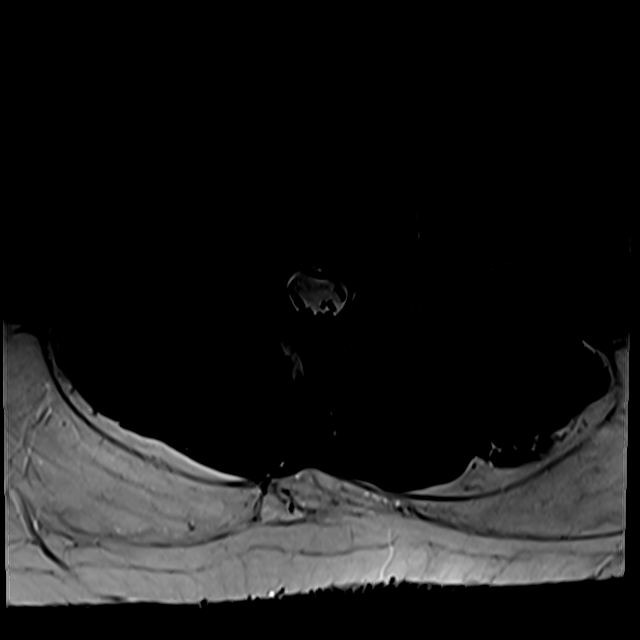
[im 19/42]
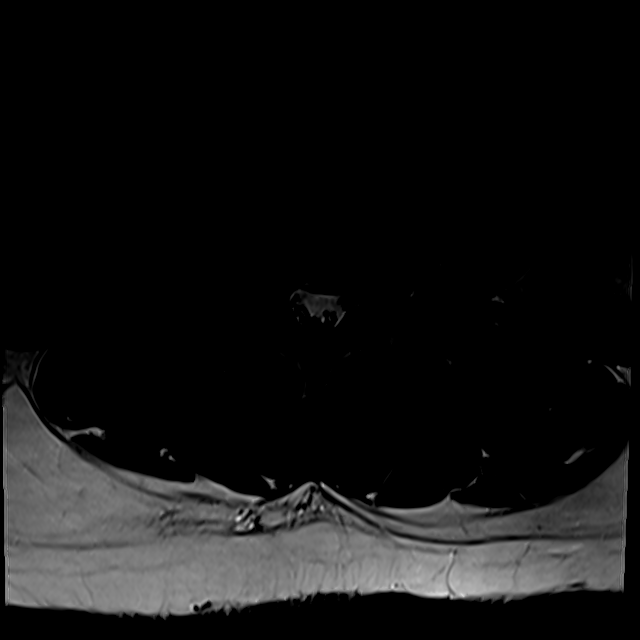
[im 23/42]
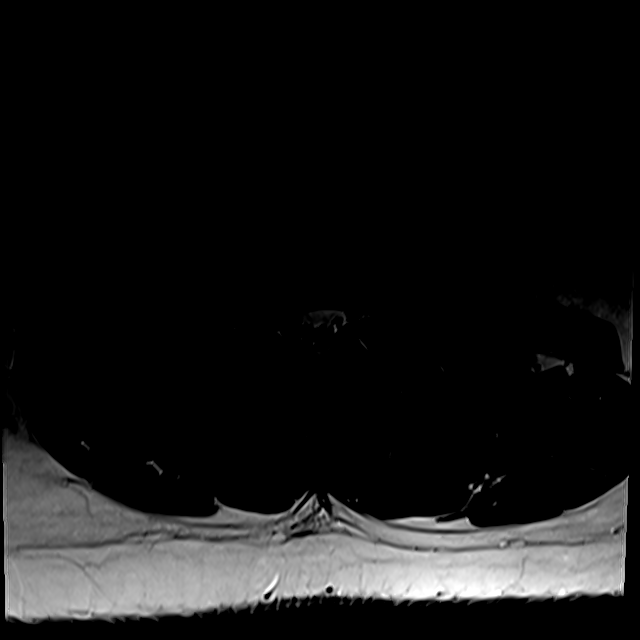
[im 35/42]
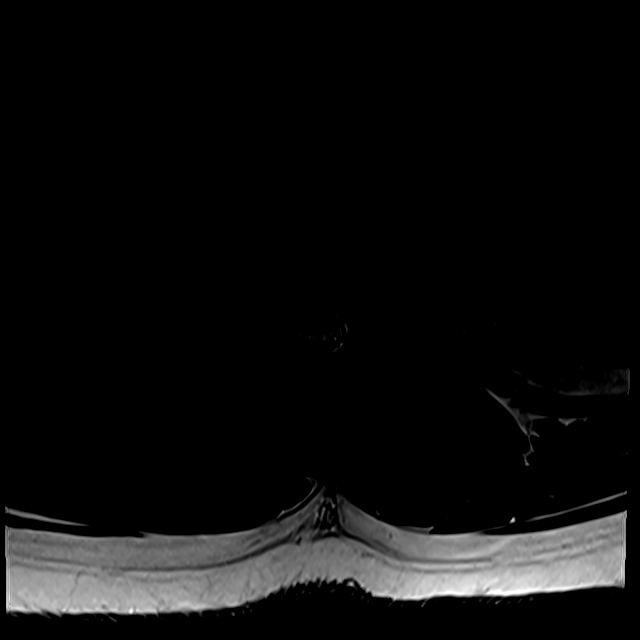

[19 of 48 positions shown; findings below may reference images not displayed]

FINDINGS: Segmentation:  5 lumbar type vertebrae

Alignment: Straightening of the lumbar spine with slight
retrolisthesis at L4-5. slight levocurvature

Vertebrae:  No fracture, evidence of discitis, or bone lesion.

Conus medullaris and cauda equina: Conus extends to the T12-L1
level. Conus and cauda equina appear normal.

Paraspinal and other soft tissues: Negative for perispinal mass or
inflammation.

Disc levels:

T12- L1: Unremarkable.

L1-L2: Unremarkable.

L2-L3: Disc narrowing and bulging with small central protrusion.

L3-L4: Disc narrowing and bulging especially at the foramina.
Annular fissures.

L4-L5: Disc narrowing and bulging with endplate degeneration.
Negative facets

L5-S1:Disc narrowing and bulging with left foraminal extrusion.
Accentuating mild facet spurring bilaterally.
IMPRESSION: 1. Symptomatic finding is likely at L5-S1 where there is left
foraminal extrusion impinging on the left L5 nerve root.
2. Noncompressive degenerative changes elsewhere are described
above.

## 2023-07-31 DIAGNOSIS — R5383 Other fatigue: Secondary | ICD-10-CM | POA: Diagnosis not present

## 2023-07-31 DIAGNOSIS — Z0130 Encounter for examination of blood pressure without abnormal findings: Secondary | ICD-10-CM | POA: Diagnosis not present

## 2023-07-31 DIAGNOSIS — Z131 Encounter for screening for diabetes mellitus: Secondary | ICD-10-CM | POA: Diagnosis not present

## 2023-07-31 DIAGNOSIS — Z114 Encounter for screening for human immunodeficiency virus [HIV]: Secondary | ICD-10-CM | POA: Diagnosis not present

## 2023-07-31 DIAGNOSIS — Z6837 Body mass index (BMI) 37.0-37.9, adult: Secondary | ICD-10-CM | POA: Diagnosis not present

## 2023-07-31 DIAGNOSIS — Z125 Encounter for screening for malignant neoplasm of prostate: Secondary | ICD-10-CM | POA: Diagnosis not present

## 2023-07-31 DIAGNOSIS — R030 Elevated blood-pressure reading, without diagnosis of hypertension: Secondary | ICD-10-CM | POA: Diagnosis not present

## 2023-07-31 DIAGNOSIS — K625 Hemorrhage of anus and rectum: Secondary | ICD-10-CM | POA: Diagnosis not present

## 2023-07-31 DIAGNOSIS — Z8639 Personal history of other endocrine, nutritional and metabolic disease: Secondary | ICD-10-CM | POA: Diagnosis not present

## 2023-07-31 DIAGNOSIS — Z0000 Encounter for general adult medical examination without abnormal findings: Secondary | ICD-10-CM | POA: Diagnosis not present

## 2023-07-31 DIAGNOSIS — R1900 Intra-abdominal and pelvic swelling, mass and lump, unspecified site: Secondary | ICD-10-CM | POA: Diagnosis not present

## 2023-08-02 DIAGNOSIS — R1900 Intra-abdominal and pelvic swelling, mass and lump, unspecified site: Secondary | ICD-10-CM | POA: Diagnosis not present

## 2023-09-07 ENCOUNTER — Emergency Department (HOSPITAL_COMMUNITY)
Admission: EM | Admit: 2023-09-07 | Discharge: 2023-09-07 | Disposition: A | Discharge status: Home / Self Care | Payer: BC Managed Care – PPO | Attending: Emergency Medicine | Admitting: Emergency Medicine

## 2023-09-07 ENCOUNTER — Encounter (HOSPITAL_COMMUNITY): Payer: Self-pay

## 2023-09-07 ENCOUNTER — Other Ambulatory Visit: Payer: Self-pay

## 2023-09-07 ENCOUNTER — Emergency Department (HOSPITAL_COMMUNITY): Payer: BC Managed Care – PPO

## 2023-09-07 DIAGNOSIS — I471 Supraventricular tachycardia, unspecified: Secondary | ICD-10-CM | POA: Insufficient documentation

## 2023-09-07 DIAGNOSIS — D72829 Elevated white blood cell count, unspecified: Secondary | ICD-10-CM | POA: Diagnosis not present

## 2023-09-07 DIAGNOSIS — I4891 Unspecified atrial fibrillation: Secondary | ICD-10-CM | POA: Diagnosis not present

## 2023-09-07 DIAGNOSIS — I499 Cardiac arrhythmia, unspecified: Secondary | ICD-10-CM | POA: Diagnosis not present

## 2023-09-07 DIAGNOSIS — R778 Other specified abnormalities of plasma proteins: Secondary | ICD-10-CM | POA: Insufficient documentation

## 2023-09-07 DIAGNOSIS — Z7982 Long term (current) use of aspirin: Secondary | ICD-10-CM | POA: Diagnosis not present

## 2023-09-07 DIAGNOSIS — R944 Abnormal results of kidney function studies: Secondary | ICD-10-CM | POA: Insufficient documentation

## 2023-09-07 DIAGNOSIS — R7989 Other specified abnormal findings of blood chemistry: Secondary | ICD-10-CM | POA: Diagnosis not present

## 2023-09-07 DIAGNOSIS — Z79899 Other long term (current) drug therapy: Secondary | ICD-10-CM | POA: Diagnosis not present

## 2023-09-07 DIAGNOSIS — Z789 Other specified health status: Secondary | ICD-10-CM | POA: Diagnosis not present

## 2023-09-07 DIAGNOSIS — E119 Type 2 diabetes mellitus without complications: Secondary | ICD-10-CM | POA: Diagnosis not present

## 2023-09-07 DIAGNOSIS — R0789 Other chest pain: Secondary | ICD-10-CM | POA: Diagnosis not present

## 2023-09-07 DIAGNOSIS — I4719 Other supraventricular tachycardia: Secondary | ICD-10-CM | POA: Diagnosis not present

## 2023-09-07 DIAGNOSIS — I10 Essential (primary) hypertension: Secondary | ICD-10-CM | POA: Insufficient documentation

## 2023-09-07 DIAGNOSIS — R0602 Shortness of breath: Secondary | ICD-10-CM | POA: Diagnosis not present

## 2023-09-07 DIAGNOSIS — R000 Tachycardia, unspecified: Secondary | ICD-10-CM | POA: Diagnosis not present

## 2023-09-07 DIAGNOSIS — R079 Chest pain, unspecified: Secondary | ICD-10-CM | POA: Diagnosis not present

## 2023-09-07 LAB — TROPONIN I (HIGH SENSITIVITY)
Troponin I (High Sensitivity): 38 ng/L — ABNORMAL HIGH (ref <18)
Troponin I (High Sensitivity): 121 ng/L (ref <18)

## 2023-09-07 LAB — CBC
HCT: 40.9 % (ref 39.0–52.0)
Hemoglobin: 13.1 g/dL (ref 13.0–17.0)
MCH: 25.2 pg — ABNORMAL LOW (ref 26.0–34.0)
MCHC: 32 g/dL (ref 30.0–36.0)
MCV: 78.8 fL — ABNORMAL LOW (ref 80.0–100.0)
Platelets: 348 10*3/uL (ref 150–400)
RBC: 5.19 MIL/uL (ref 4.22–5.81)
RDW: 15.3 % (ref 11.5–15.5)
WBC: 12.6 10*3/uL — ABNORMAL HIGH (ref 4.0–10.5)
nRBC: 0 % (ref 0.0–0.2)

## 2023-09-07 LAB — RAPID URINE DRUG SCREEN, HOSP PERFORMED
Amphetamines: NOT DETECTED
Barbiturates: NOT DETECTED
Benzodiazepines: NOT DETECTED
Cocaine: POSITIVE — AB
Opiates: NOT DETECTED
Tetrahydrocannabinol: POSITIVE — AB

## 2023-09-07 LAB — BASIC METABOLIC PANEL
Anion gap: 9 (ref 5–15)
BUN: 14 mg/dL (ref 6–20)
CO2: 25 mmol/L (ref 22–32)
Calcium: 9.2 mg/dL (ref 8.9–10.3)
Chloride: 103 mmol/L (ref 98–111)
Creatinine, Ser: 1.51 mg/dL — ABNORMAL HIGH (ref 0.61–1.24)
GFR, Estimated: 56 mL/min — ABNORMAL LOW (ref >60)
Glucose, Bld: 183 mg/dL — ABNORMAL HIGH (ref 70–99)
Potassium: 3.4 mmol/L — ABNORMAL LOW (ref 3.5–5.1)
Sodium: 137 mmol/L (ref 135–145)

## 2023-09-07 MED ORDER — SODIUM CHLORIDE 0.9 % IV BOLUS
1000.0000 mL | Freq: Once | INTRAVENOUS | Status: AC
  Administered 2023-09-07: 1000 mL via INTRAVENOUS

## 2023-09-07 NOTE — ED Provider Notes (Signed)
[place] EMERGENCY DEPARTMENT AT [facility] Provider Note   CSN: [id number] Arrival date & time: 09/07/23  1623     History {Add pertinent medical, surgical, social history, OB history to HPI:1} Chief Complaint  Patient presents with   Chest Pain    Terrence Conrad is a 53 y.o. male.  He has a history of diabetes hypertension coronary calcification.  He said he drank a heavily caffeinated drink this morning and while was at work he began experiencing some diaphoresis and felt short of breath.  He was able to work but he went to urgent care where they found him to be in a narrow complex rhythm question SVT versus atrial fibrillation.  EMS gave him 20 mg Cardizem and aspirin.  Currently patient feels back to baseline now.  He does endorse using some marijuana and cocaine recently.  He also said he tried this drink once before and I gave him a rapid heart rate.  He currently denies any chest pain or shortness of breath nausea vomiting.  No headache numbness or weakness.  The history is provided by the patient.  Shortness of Breath Severity:  Moderate Onset quality:  Sudden Duration:  4 hours Timing:  Intermittent Progression:  Resolved Chronicity:  Recurrent Relieved by:  None tried Worsened by:  Activity Ineffective treatments:  None tried Associated symptoms: chest pain and diaphoresis   Associated symptoms: no abdominal pain, no cough, no fever, no hemoptysis, no sputum production and no vomiting   Risk factors: tobacco use        Home Medications Prior to Admission medications   Medication Sig Start Date End Date Taking? Authorizing Provider  gabapentin (NEURONTIN) 300 MG capsule Take 300 mg by mouth 3 (three) times daily. 01/06/22   [provider]  HYDROcodone-acetaminophen (NORCO/VICODIN) 5-325 MG tablet Take 1-2 tablets by mouth every 4 (four) hours as needed for moderate pain. 04/30/19   Gilda Crease, MD  ibuprofen (ADVIL) 800 MG tablet Take 1  tablet (800 mg total) by mouth every 6 (six) hours as needed for moderate pain. 04/30/19   Gilda Crease, MD  losartan-hydrochlorothiazide (HYZAAR) 100-25 MG tablet Take 1 tablet by mouth daily. 01/08/22   [provider]  methocarbamol (ROBAXIN) 500 MG tablet Take 1 tablet (500 mg total) by mouth every 8 (eight) hours as needed for muscle spasms. 04/30/19   Gilda Crease, MD  terbinafine (LAMISIL) 250 MG tablet Take 1 tablet (250 mg total) by mouth daily. 12/27/14   Lenn Sink, DPM  tiZANidine (ZANAFLEX) 4 MG capsule 1-2 capsules as needed 06/09/21   [provider]      Allergies    Patient has no known allergies.    Review of Systems   Review of Systems  Constitutional:  Positive for diaphoresis. Negative for fever.  Eyes:  Negative for visual disturbance.  Respiratory:  Positive for shortness of breath. Negative for cough, hemoptysis and sputum production.   Cardiovascular:  Positive for chest pain.  Gastrointestinal:  Negative for abdominal pain and vomiting.    Physical Exam Updated Vital Signs BP (!) 139/95   Pulse 87   Temp 98 F (36.7 C) (Axillary)   Resp 19   Ht 6\' 2"  (1.88 m)   Wt 117.9 kg   SpO2 97%   BMI 33.38 kg/m  Physical Exam Vitals and nursing note reviewed.  Constitutional:      General: He is not in acute distress.    Appearance: Normal appearance. He  is well-developed.  HENT:     Head: Normocephalic and atraumatic.  Eyes:     Conjunctiva/sclera: Conjunctivae normal.  Cardiovascular:     Rate and Rhythm: Normal rate and regular rhythm.     Heart sounds: Normal heart sounds. No murmur heard. Pulmonary:     Effort: Pulmonary effort is normal. No respiratory distress.     Breath sounds: Normal breath sounds.  Abdominal:     Palpations: Abdomen is soft.     Tenderness: There is no abdominal tenderness. There is no guarding or rebound.  Musculoskeletal:        General: No swelling. Normal range of motion.      Cervical back: Neck supple.     Right lower leg: No tenderness. No edema.     Left lower leg: No tenderness. No edema.  Skin:    General: Skin is warm and dry.     Capillary Refill: Capillary refill takes less than 2 seconds.  Neurological:     General: No focal deficit present.     Mental Status: He is alert.     Sensory: No sensory deficit.     Motor: No weakness.   ECG at urgent care today  ED Results / Procedures / Treatments   Labs (all labs ordered are listed, but only abnormal results are displayed) Labs Reviewed  BASIC METABOLIC PANEL  CBC  TROPONIN I (HIGH SENSITIVITY)    EKG EKG Interpretation Date/Time:  Wednesday September 07 2023 16:31:11 EST Ventricular Rate:  86 PR Interval:  152 QRS Duration:  111 QT Interval:  386 QTC Calculation: 462 R Axis:   12  Text Interpretation: Sinus rhythm Left ventricular hypertrophy No significant change since prior 8/23 Confirmed by Meridee Score [phone]) on 09/07/2023 4:45:50 PM  Radiology No results found.  Procedures Procedures  {Document cardiac monitor, telemetry assessment procedure when appropriate:1}  Medications Ordered in ED Medications - No data to display  ED Course/ Medical Decision Making/ A&P   {   Click here for ABCD2, HEART and other calculatorsREFRESH Note before signing :1}                              Medical Decision Making Amount and/or Complexity of Data Reviewed Labs: ordered. Radiology: ordered.   This patient complains of ***; this involves an extensive number of treatment Options and is a complaint that carries with it a high risk of complications and morbidity. The differential includes ***  I ordered, reviewed and interpreted labs, which included *** I ordered medication *** and reviewed PMP when indicated. I ordered imaging studies which included *** and I independently    visualized and interpreted imaging which showed *** Additional history obtained from *** Previous records  obtained and reviewed *** I consulted *** and discussed lab and imaging findings and discussed disposition.  Cardiac monitoring reviewed, *** Social determinants considered, *** Critical Interventions: ***  After the interventions stated above, I reevaluated the patient and found *** Admission and further testing considered, ***   {Document critical care time when appropriate:1} {Document review of labs and clinical decision tools ie heart score, Chads2Vasc2 etc:1}  {Document your independent review of radiology images, and any outside records:1} {Document your discussion with family members, caretakers, and with consultants:1} {Document social determinants of health affecting pt's care:1} {Document your decision making why or why not admission, treatments were needed:1} Final Clinical Impression(s) / ED Diagnoses Final diagnoses:  None  Rx / DC Orders ED Discharge Orders     None

## 2023-09-07 NOTE — ED Notes (Signed)
Dr. Charm Barges notified of critical vale - troponin 121

## 2023-09-07 NOTE — ED Triage Notes (Addendum)
Pt arrives via EMS from [facility]. Pt reports that he had sob and chest while at the gym. Pt reports drinking a drink that had approximately 2,000mg  of caffeine. At urgent care patient was in SVT and then went into Afib RVR. EMS administered 20mg  of cardizem which converted him to NSR. UC also administered 325mg  of asa. Pt denies cp or sob at this time.

## 2023-09-07 NOTE — Discharge Instructions (Addendum)
You are seen in the Emergency Department for an episode of shortness of breath sweats lightheadedness.  You were in an abnormal fast rhythm.  Your rhythm is returned to normal.  Please drink plenty of noncaffeinated fluids.  Avoid any other stimulants including cocaine.  We are putting in a referral to see a cardiologist.  Return to the emergency department if any worsening or concerning symptoms.

## 2023-09-08 DIAGNOSIS — K760 Fatty (change of) liver, not elsewhere classified: Secondary | ICD-10-CM | POA: Diagnosis not present

## 2023-09-08 DIAGNOSIS — K625 Hemorrhage of anus and rectum: Secondary | ICD-10-CM | POA: Diagnosis not present

## 2023-09-08 DIAGNOSIS — Z125 Encounter for screening for malignant neoplasm of prostate: Secondary | ICD-10-CM | POA: Diagnosis not present

## 2023-09-08 DIAGNOSIS — R030 Elevated blood-pressure reading, without diagnosis of hypertension: Secondary | ICD-10-CM | POA: Diagnosis not present

## 2023-09-08 DIAGNOSIS — Z6836 Body mass index (BMI) 36.0-36.9, adult: Secondary | ICD-10-CM | POA: Diagnosis not present

## 2023-09-08 DIAGNOSIS — R079 Chest pain, unspecified: Secondary | ICD-10-CM | POA: Diagnosis not present

## 2023-09-08 DIAGNOSIS — Z8639 Personal history of other endocrine, nutritional and metabolic disease: Secondary | ICD-10-CM | POA: Diagnosis not present

## 2023-09-08 DIAGNOSIS — R000 Tachycardia, unspecified: Secondary | ICD-10-CM | POA: Diagnosis not present

## 2023-10-01 DIAGNOSIS — K648 Other hemorrhoids: Secondary | ICD-10-CM | POA: Diagnosis not present

## 2023-10-18 DIAGNOSIS — Z1211 Encounter for screening for malignant neoplasm of colon: Secondary | ICD-10-CM | POA: Diagnosis not present

## 2023-10-18 DIAGNOSIS — K625 Hemorrhage of anus and rectum: Secondary | ICD-10-CM | POA: Diagnosis not present

## 2023-10-25 DIAGNOSIS — K529 Noninfective gastroenteritis and colitis, unspecified: Secondary | ICD-10-CM | POA: Diagnosis not present

## 2023-11-18 DIAGNOSIS — K648 Other hemorrhoids: Secondary | ICD-10-CM | POA: Diagnosis not present

## 2023-11-18 DIAGNOSIS — K760 Fatty (change of) liver, not elsewhere classified: Secondary | ICD-10-CM | POA: Diagnosis not present

## 2023-12-21 DIAGNOSIS — L720 Epidermal cyst: Secondary | ICD-10-CM | POA: Diagnosis not present

## 2023-12-21 DIAGNOSIS — D210 Benign neoplasm of connective and other soft tissue of head, face and neck: Secondary | ICD-10-CM | POA: Diagnosis not present

## 2023-12-30 ENCOUNTER — Ambulatory Visit: Attending: Cardiology | Admitting: Cardiology

## 2024-01-03 ENCOUNTER — Encounter: Payer: Self-pay | Admitting: Cardiology

## 2024-01-06 DIAGNOSIS — M7989 Other specified soft tissue disorders: Secondary | ICD-10-CM | POA: Diagnosis not present

## 2024-02-21 DIAGNOSIS — M62838 Other muscle spasm: Secondary | ICD-10-CM | POA: Diagnosis not present

## 2024-02-21 DIAGNOSIS — Z125 Encounter for screening for malignant neoplasm of prostate: Secondary | ICD-10-CM | POA: Diagnosis not present

## 2024-02-21 DIAGNOSIS — R5383 Other fatigue: Secondary | ICD-10-CM | POA: Diagnosis not present

## 2024-02-21 DIAGNOSIS — K219 Gastro-esophageal reflux disease without esophagitis: Secondary | ICD-10-CM | POA: Diagnosis not present

## 2024-02-21 DIAGNOSIS — E785 Hyperlipidemia, unspecified: Secondary | ICD-10-CM | POA: Diagnosis not present

## 2024-02-21 DIAGNOSIS — E1165 Type 2 diabetes mellitus with hyperglycemia: Secondary | ICD-10-CM | POA: Diagnosis not present

## 2024-02-21 DIAGNOSIS — I10 Essential (primary) hypertension: Secondary | ICD-10-CM | POA: Diagnosis not present

## 2024-07-16 DIAGNOSIS — M25561 Pain in right knee: Secondary | ICD-10-CM | POA: Diagnosis not present

## 2024-08-07 DIAGNOSIS — M25561 Pain in right knee: Secondary | ICD-10-CM | POA: Diagnosis not present
